# Patient Record
Sex: Male | Born: 1950 | Race: White | Hispanic: No | Marital: Married | Smoking: Current every day smoker
Health system: Southern US, Community
[De-identification: ages and names within clinical notes are randomized; demographics above are authoritative.]

## PROBLEM LIST (undated history)

## (undated) DIAGNOSIS — I2510 Atherosclerotic heart disease of native coronary artery without angina pectoris: Secondary | ICD-10-CM

## (undated) DIAGNOSIS — F17200 Nicotine dependence, unspecified, uncomplicated: Secondary | ICD-10-CM

## (undated) DIAGNOSIS — E785 Hyperlipidemia, unspecified: Secondary | ICD-10-CM

## (undated) HISTORY — DX: Hyperlipidemia, unspecified: E78.5

## (undated) HISTORY — PX: NECK SURGERY: SHX720

## (undated) HISTORY — PX: LUNG BIOPSY: SHX232

## (undated) HISTORY — PX: ANAL FISSURE REPAIR: SHX2312

## (undated) HISTORY — DX: Nicotine dependence, unspecified, uncomplicated: F17.200

## (undated) HISTORY — PX: OTHER SURGICAL HISTORY: SHX169

## (undated) HISTORY — DX: Atherosclerotic heart disease of native coronary artery without angina pectoris: I25.10

## (undated) HISTORY — PX: NEPHRECTOMY: SHX65

---

## 1999-01-04 ENCOUNTER — Ambulatory Visit: Admission: RE | Admit: 1999-01-04 | Discharge: 1999-01-04 | Payer: Self-pay | Admitting: Internal Medicine

## 1999-01-29 ENCOUNTER — Emergency Department (HOSPITAL_COMMUNITY): Admission: EM | Admit: 1999-01-29 | Discharge: 1999-01-29 | Payer: Self-pay | Admitting: Emergency Medicine

## 1999-01-29 ENCOUNTER — Encounter: Payer: Self-pay | Admitting: Emergency Medicine

## 1999-03-07 ENCOUNTER — Ambulatory Visit: Admission: RE | Admit: 1999-03-07 | Discharge: 1999-03-07 | Payer: Self-pay | Admitting: Internal Medicine

## 2000-03-08 ENCOUNTER — Encounter: Payer: Self-pay | Admitting: Emergency Medicine

## 2000-03-08 ENCOUNTER — Observation Stay (HOSPITAL_COMMUNITY): Admission: EM | Admit: 2000-03-08 | Discharge: 2000-03-08 | Payer: Self-pay | Admitting: Emergency Medicine

## 2000-12-03 ENCOUNTER — Encounter: Admission: RE | Admit: 2000-12-03 | Discharge: 2000-12-03 | Payer: Self-pay | Admitting: *Deleted

## 2000-12-03 ENCOUNTER — Encounter: Payer: Self-pay | Admitting: *Deleted

## 2000-12-04 ENCOUNTER — Ambulatory Visit (HOSPITAL_BASED_OUTPATIENT_CLINIC_OR_DEPARTMENT_OTHER): Admission: RE | Admit: 2000-12-04 | Discharge: 2000-12-04 | Payer: Self-pay | Admitting: *Deleted

## 2000-12-04 ENCOUNTER — Encounter (INDEPENDENT_AMBULATORY_CARE_PROVIDER_SITE_OTHER): Payer: Self-pay | Admitting: *Deleted

## 2002-11-18 ENCOUNTER — Encounter: Payer: Self-pay | Admitting: *Deleted

## 2002-11-18 ENCOUNTER — Ambulatory Visit (HOSPITAL_COMMUNITY): Admission: RE | Admit: 2002-11-18 | Discharge: 2002-11-18 | Payer: Self-pay | Admitting: *Deleted

## 2003-01-02 ENCOUNTER — Encounter: Payer: Self-pay | Admitting: Orthopedic Surgery

## 2003-01-02 ENCOUNTER — Ambulatory Visit (HOSPITAL_COMMUNITY): Admission: RE | Admit: 2003-01-02 | Discharge: 2003-01-02 | Payer: Self-pay | Admitting: Orthopedic Surgery

## 2008-08-18 ENCOUNTER — Ambulatory Visit (HOSPITAL_COMMUNITY): Admission: RE | Admit: 2008-08-18 | Discharge: 2008-08-18 | Payer: Self-pay | Admitting: Urology

## 2008-08-27 ENCOUNTER — Ambulatory Visit (HOSPITAL_COMMUNITY): Admission: RE | Admit: 2008-08-27 | Discharge: 2008-08-27 | Payer: Self-pay | Admitting: Urology

## 2008-09-10 ENCOUNTER — Encounter: Payer: Self-pay | Admitting: Urology

## 2008-09-10 ENCOUNTER — Inpatient Hospital Stay (HOSPITAL_COMMUNITY): Admission: RE | Admit: 2008-09-10 | Discharge: 2008-09-12 | Payer: Self-pay | Admitting: Urology

## 2008-11-03 ENCOUNTER — Inpatient Hospital Stay (HOSPITAL_COMMUNITY): Admission: AD | Admit: 2008-11-03 | Discharge: 2008-11-04 | Payer: Self-pay | Admitting: Surgery

## 2008-11-27 ENCOUNTER — Ambulatory Visit (HOSPITAL_COMMUNITY): Admission: RE | Admit: 2008-11-27 | Discharge: 2008-11-27 | Payer: Self-pay | Admitting: Urology

## 2009-03-22 HISTORY — PX: CARDIOVASCULAR STRESS TEST: SHX262

## 2009-05-05 ENCOUNTER — Ambulatory Visit (HOSPITAL_COMMUNITY): Admission: RE | Admit: 2009-05-05 | Discharge: 2009-05-05 | Payer: Self-pay | Admitting: Urology

## 2009-09-27 ENCOUNTER — Ambulatory Visit (HOSPITAL_COMMUNITY): Admission: RE | Admit: 2009-09-27 | Discharge: 2009-09-27 | Payer: Self-pay | Admitting: Urology

## 2009-10-06 ENCOUNTER — Ambulatory Visit: Payer: Self-pay | Admitting: Thoracic Surgery

## 2009-10-07 ENCOUNTER — Ambulatory Visit: Payer: Self-pay | Admitting: Thoracic Surgery

## 2009-10-14 ENCOUNTER — Inpatient Hospital Stay (HOSPITAL_COMMUNITY): Admission: RE | Admit: 2009-10-14 | Discharge: 2009-10-17 | Payer: Self-pay | Admitting: Thoracic Surgery

## 2009-10-14 ENCOUNTER — Encounter: Payer: Self-pay | Admitting: Thoracic Surgery

## 2009-10-17 ENCOUNTER — Ambulatory Visit: Payer: Self-pay | Admitting: Hematology and Oncology

## 2009-10-19 ENCOUNTER — Ambulatory Visit: Payer: Self-pay | Admitting: Hematology and Oncology

## 2009-10-19 LAB — CBC WITH DIFFERENTIAL/PLATELET
BASO%: 0.4 % (ref 0.0–2.0)
HCT: 39 % (ref 38.4–49.9)
HGB: 13.4 g/dL (ref 13.0–17.1)
MCHC: 34.3 g/dL (ref 32.0–36.0)
MONO#: 0.6 10*3/uL (ref 0.1–0.9)
NEUT%: 53.7 % (ref 39.0–75.0)
WBC: 6.1 10*3/uL (ref 4.0–10.3)
lymph#: 1.9 10*3/uL (ref 0.9–3.3)

## 2009-10-19 LAB — COMPREHENSIVE METABOLIC PANEL
ALT: 18 U/L (ref 0–53)
AST: 18 U/L (ref 0–37)
BUN: 25 mg/dL — ABNORMAL HIGH (ref 6–23)
CO2: 29 mEq/L (ref 19–32)
Chloride: 107 mEq/L (ref 96–112)
Creatinine, Ser: 1.61 mg/dL — ABNORMAL HIGH (ref 0.40–1.50)
Glucose, Bld: 104 mg/dL — ABNORMAL HIGH (ref 70–99)
Total Bilirubin: 0.4 mg/dL (ref 0.3–1.2)
Total Protein: 6.4 g/dL (ref 6.0–8.3)

## 2009-10-22 ENCOUNTER — Encounter: Admission: RE | Admit: 2009-10-22 | Discharge: 2009-10-22 | Payer: Self-pay | Admitting: Thoracic Surgery

## 2009-10-22 ENCOUNTER — Ambulatory Visit: Payer: Self-pay | Admitting: Thoracic Surgery

## 2009-10-27 LAB — COMPREHENSIVE METABOLIC PANEL
Alkaline Phosphatase: 38 U/L — ABNORMAL LOW (ref 39–117)
CO2: 21 mEq/L (ref 19–32)
Creatinine, Ser: 1.28 mg/dL (ref 0.40–1.50)
Glucose, Bld: 122 mg/dL — ABNORMAL HIGH (ref 70–99)
Total Bilirubin: 0.5 mg/dL (ref 0.3–1.2)

## 2009-11-01 ENCOUNTER — Ambulatory Visit (HOSPITAL_COMMUNITY): Admission: RE | Admit: 2009-11-01 | Discharge: 2009-11-01 | Payer: Self-pay | Admitting: Hematology and Oncology

## 2009-11-16 ENCOUNTER — Encounter: Admission: RE | Admit: 2009-11-16 | Discharge: 2009-11-16 | Payer: Self-pay | Admitting: Thoracic Surgery

## 2009-11-16 ENCOUNTER — Ambulatory Visit: Payer: Self-pay | Admitting: Thoracic Surgery

## 2010-04-18 ENCOUNTER — Ambulatory Visit (HOSPITAL_COMMUNITY): Admission: RE | Admit: 2010-04-18 | Discharge: 2010-04-18 | Payer: Self-pay | Admitting: General Surgery

## 2010-07-30 IMAGING — CR DG CHEST 2V
2 series · 2 of 2 positions shown · non-contrast
Comparison: 10/16/2009

CLINICAL DATA: Chest tube removal.

CHEST - 2 VIEW

[w chest pa]
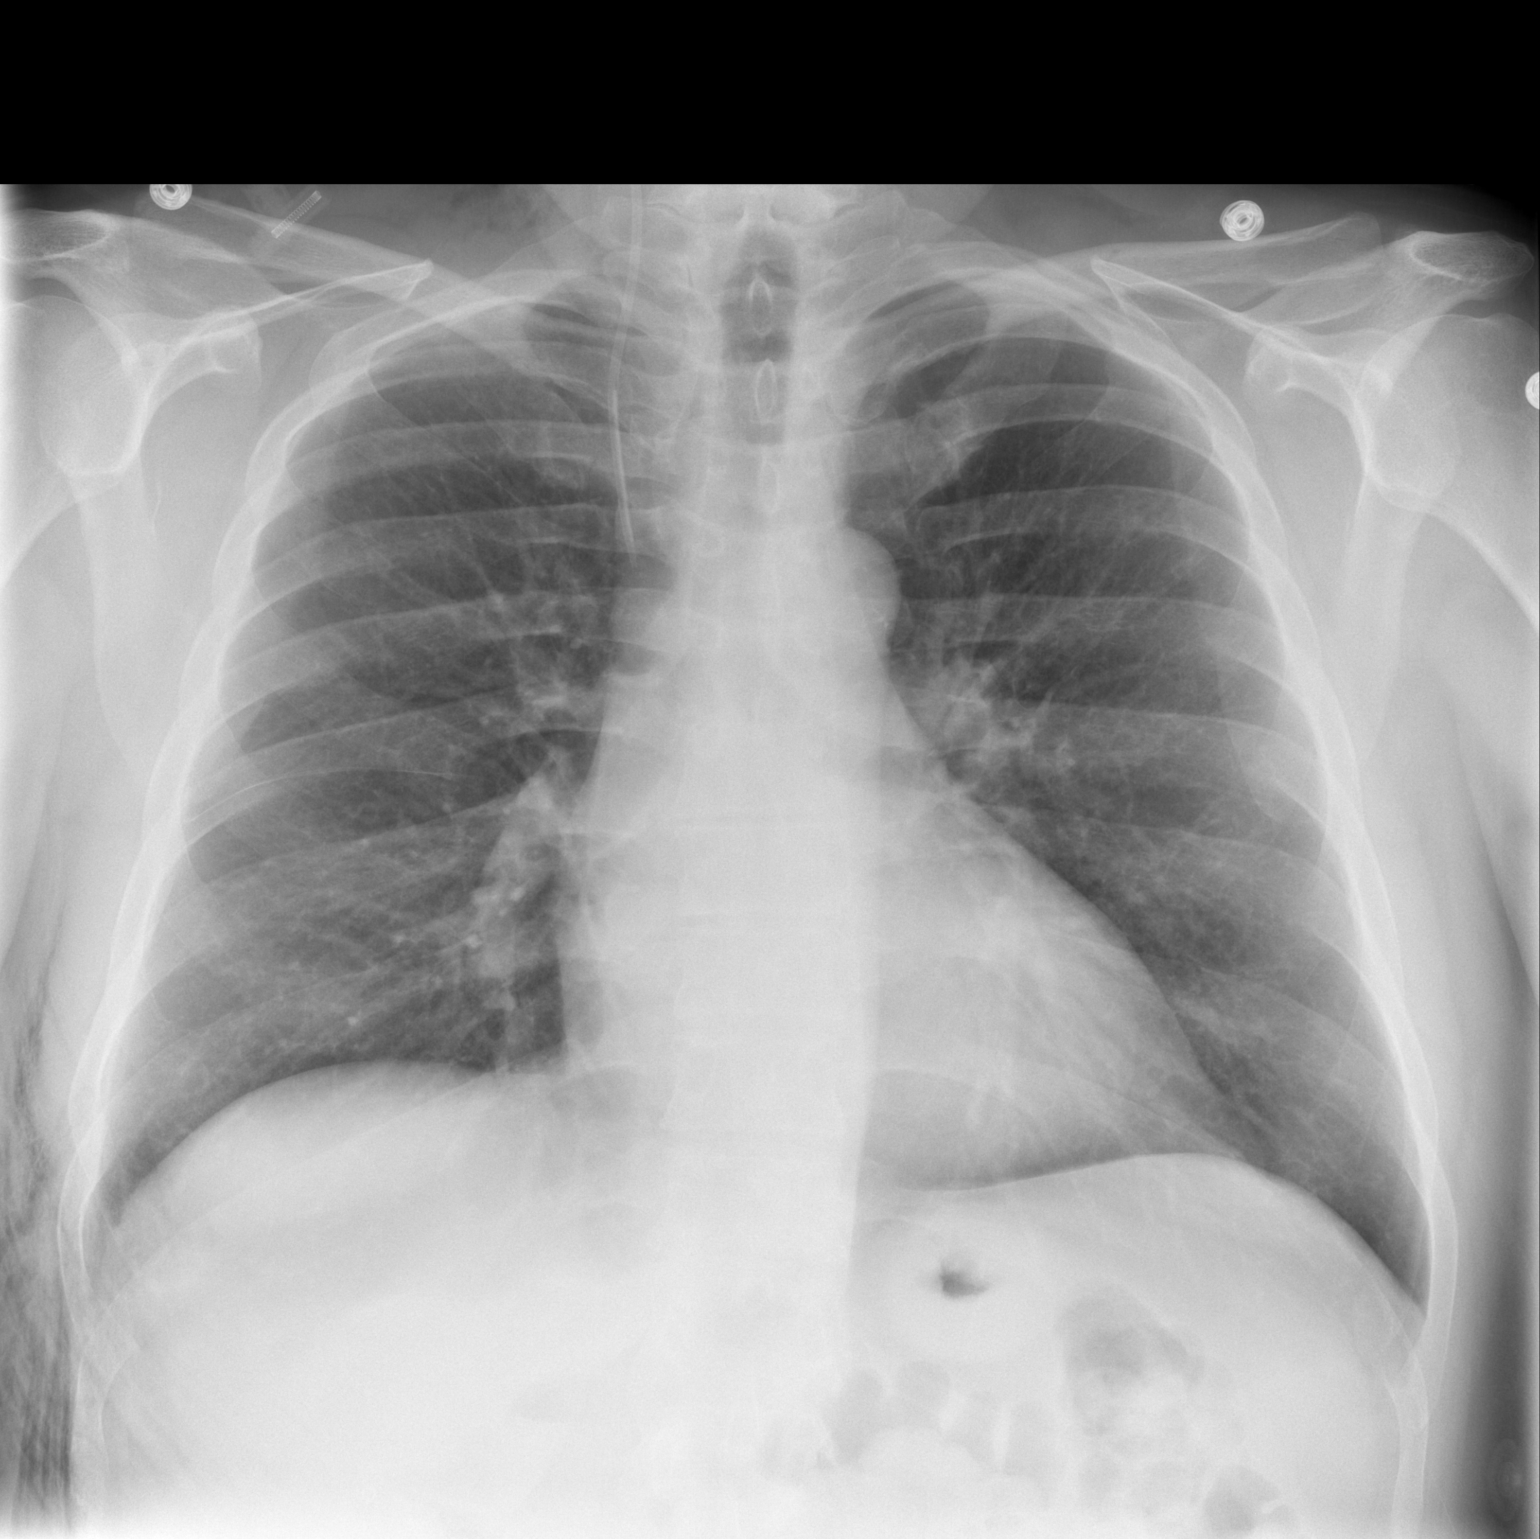

[w chest lat]
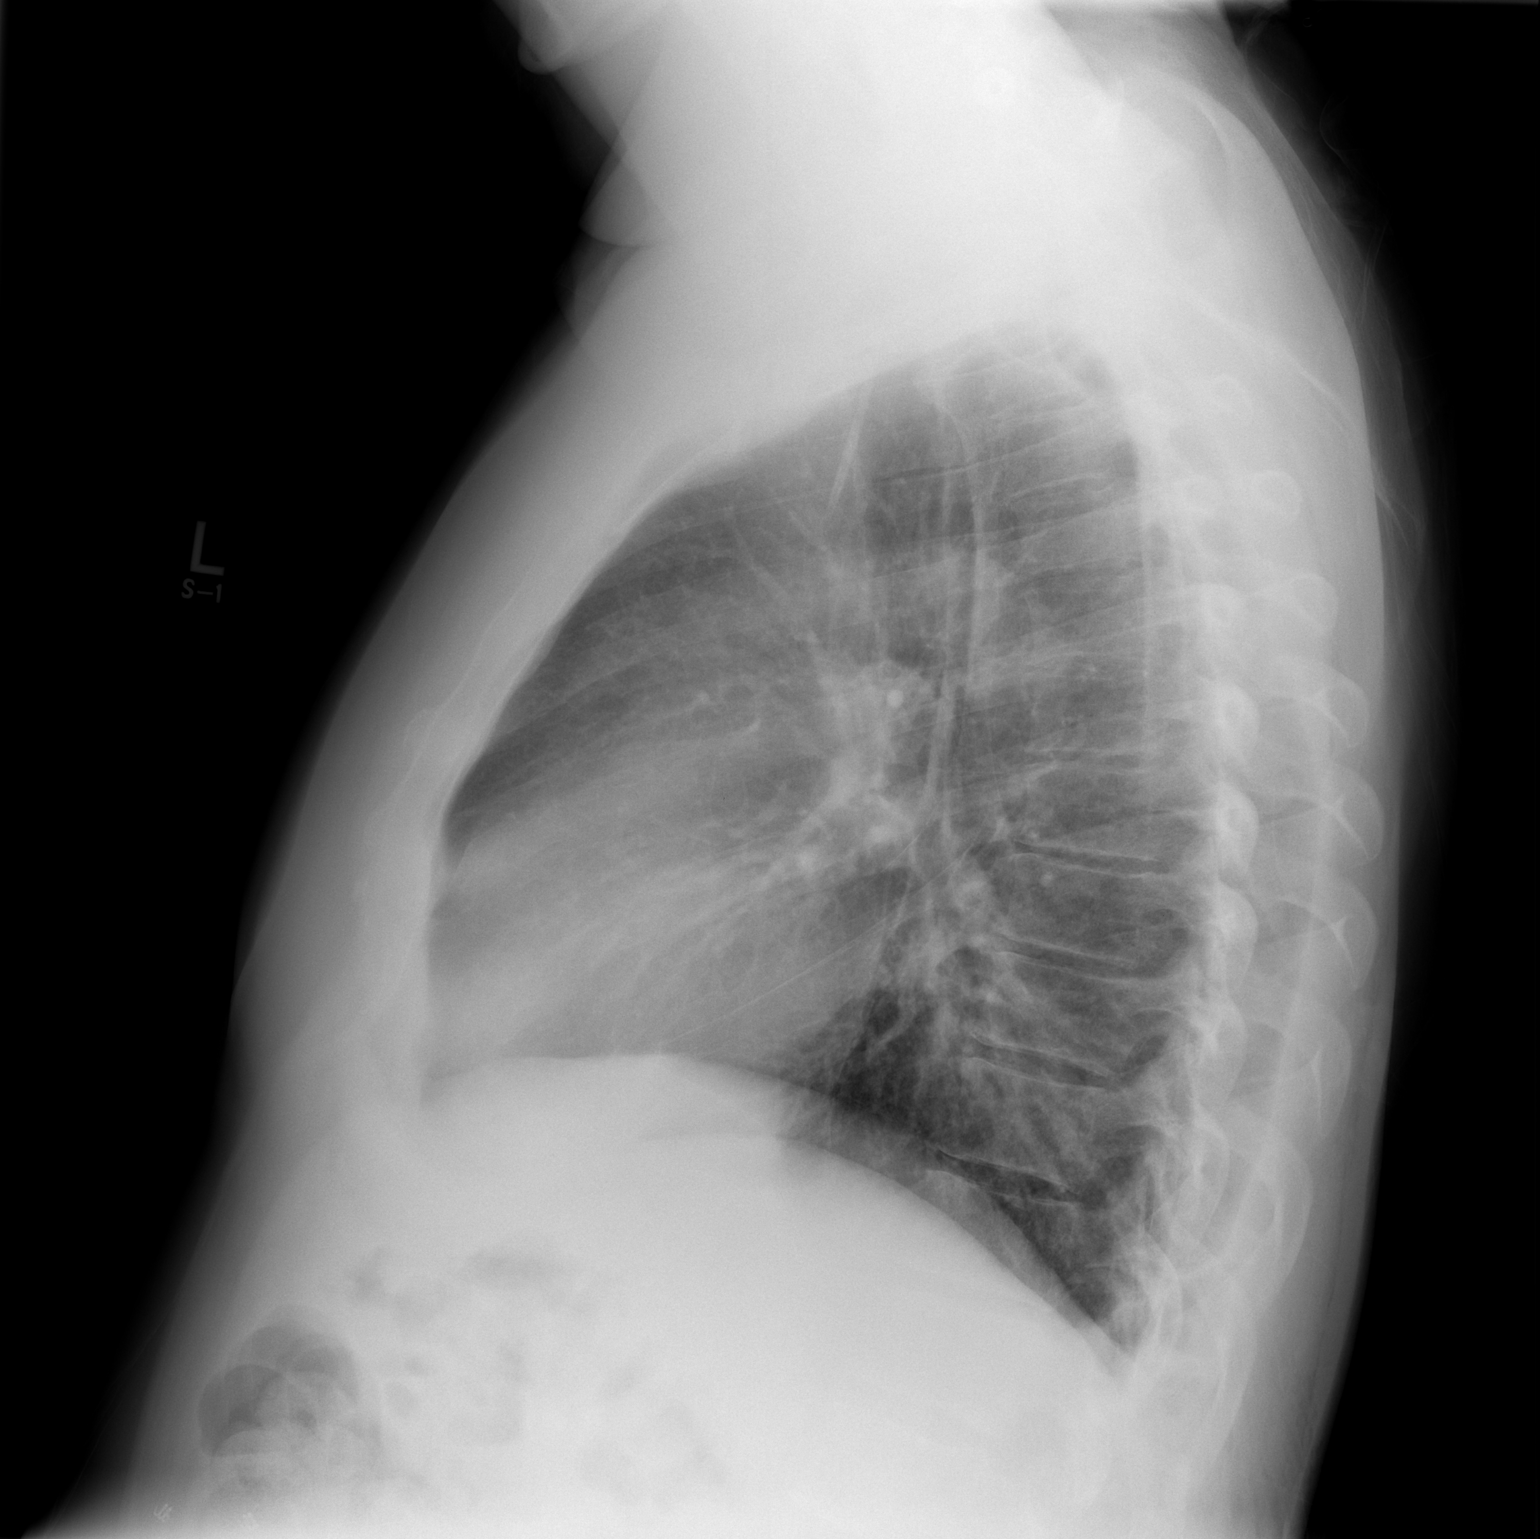

[2 of 2 positions shown; findings below may reference images not displayed]

FINDINGS: Right central line remains in place, unchanged.  No
pneumothorax.  Right subcutaneous air noted.  Lungs are clear.
Heart is borderline in size.  No effusions.
IMPRESSION: No pneumothorax.  No acute findings.

## 2010-08-04 IMAGING — CR DG CHEST 2V
2 series · 2 of 2 positions shown · non-contrast
Comparison: 10/17/2009

CLINICAL DATA: Status post right VATS.

CHEST - 2 VIEW

[w chest pa *]
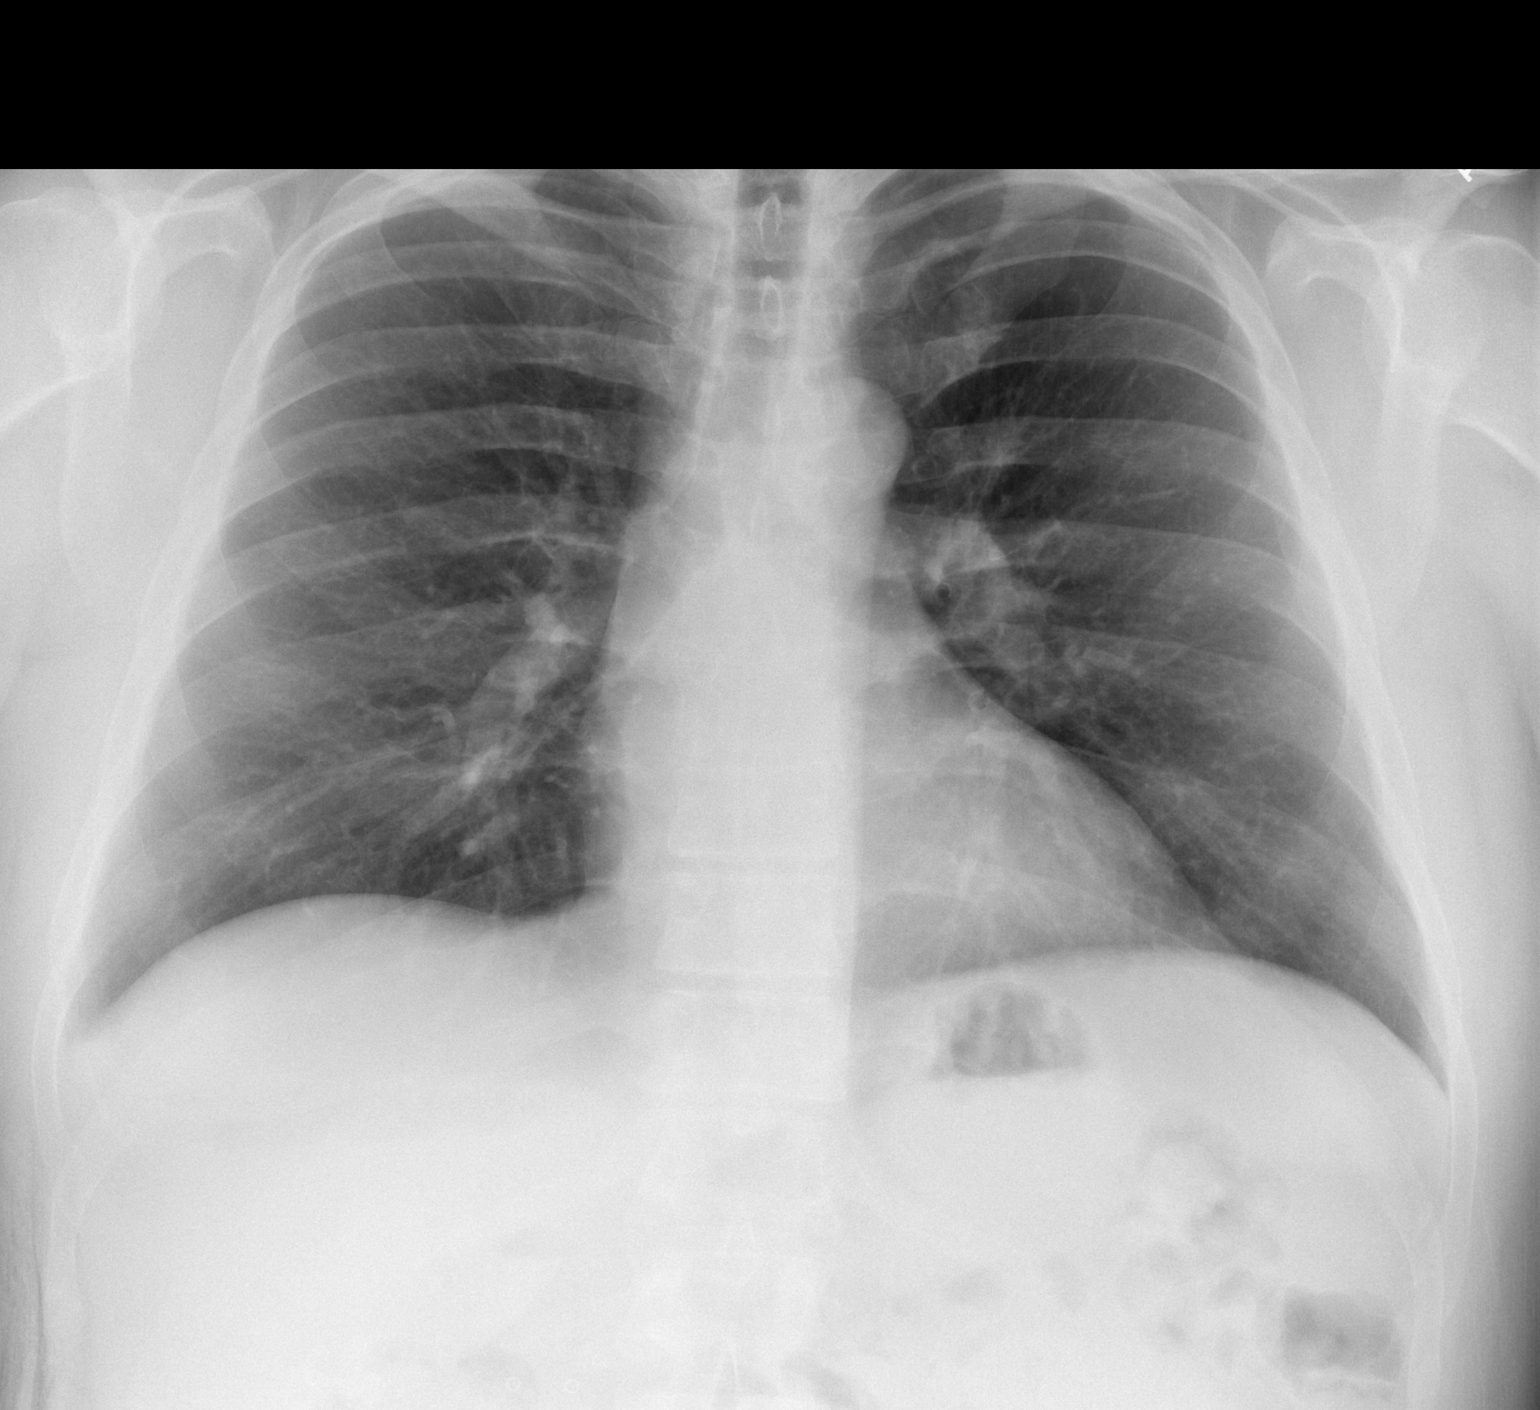

[w chest lat *]
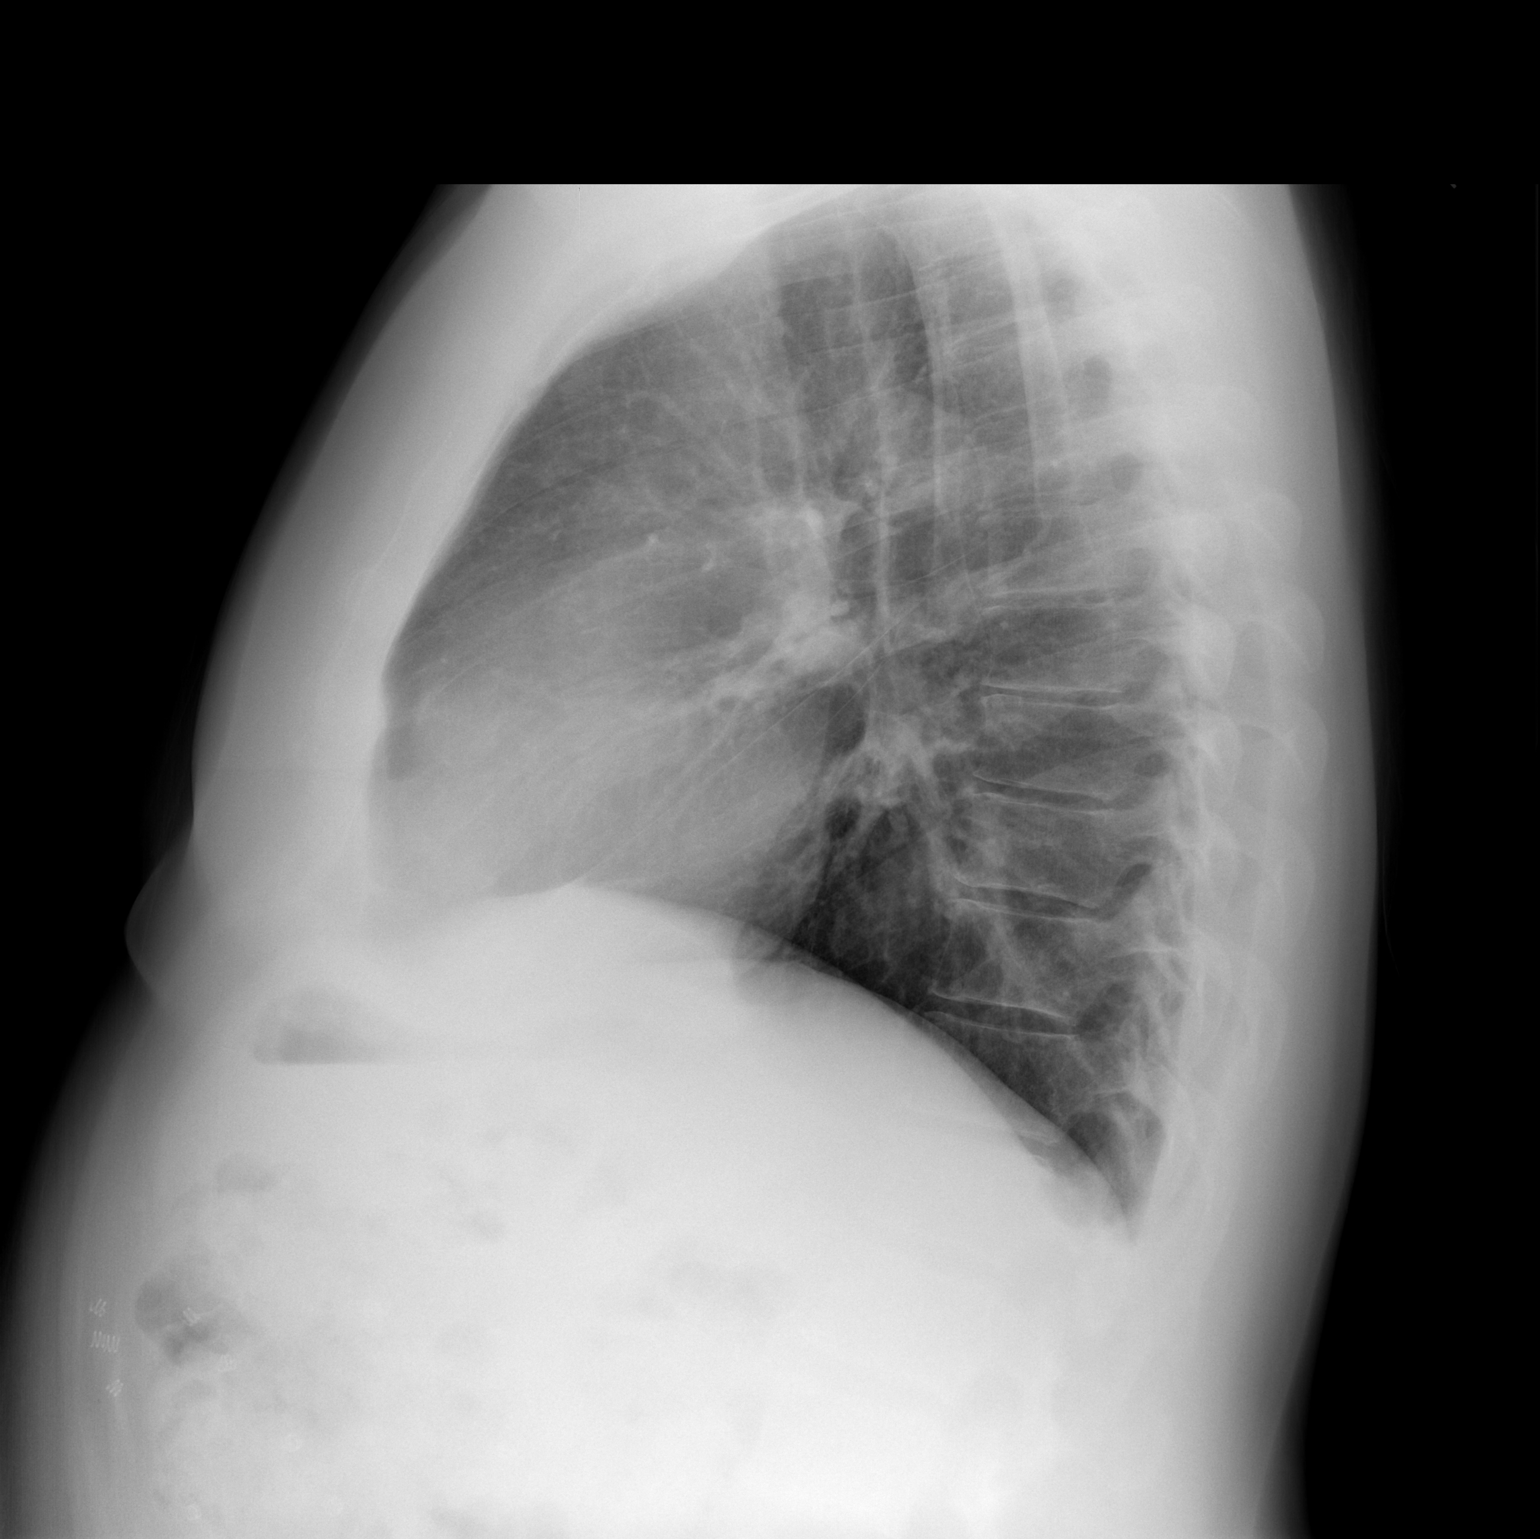

[2 of 2 positions shown; findings below may reference images not displayed]

FINDINGS: Two views of the chest demonstrate clear lungs.  No
evidence for a  pneumothorax.  The right jugular central line has
been removed.  No evidence for pleural effusions. Normal appearance
of the cardiac silhouette.  The trachea is midline.
IMPRESSION: No acute chest findings.

## 2010-08-23 ENCOUNTER — Ambulatory Visit: Payer: Self-pay | Admitting: Cardiology

## 2010-08-29 IMAGING — CR DG CHEST 2V
2 series · 2 of 2 positions shown · non-contrast
Comparison: Chest x-ray of 10/22/2009

CLINICAL DATA: Status post right VATS, follow-up

CHEST - 2 VIEW

[view not recorded (1 of 2)]
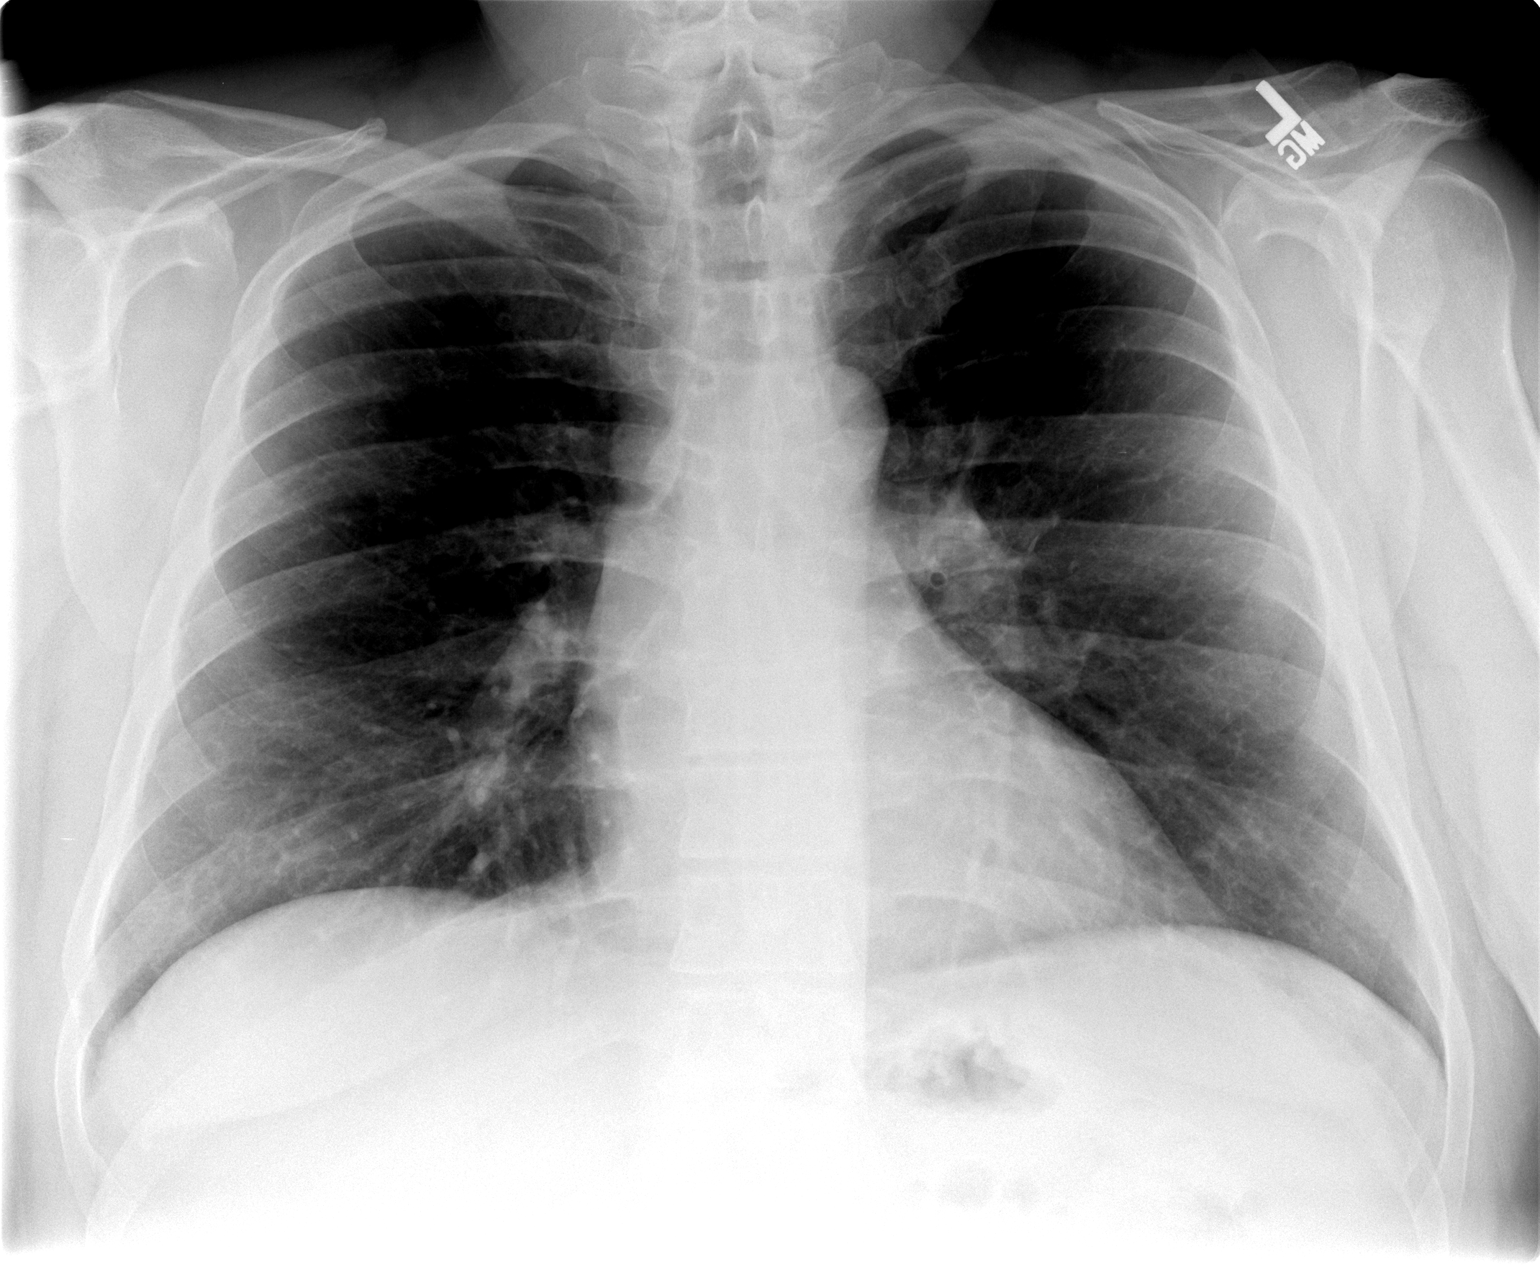

[view not recorded (2 of 2)]
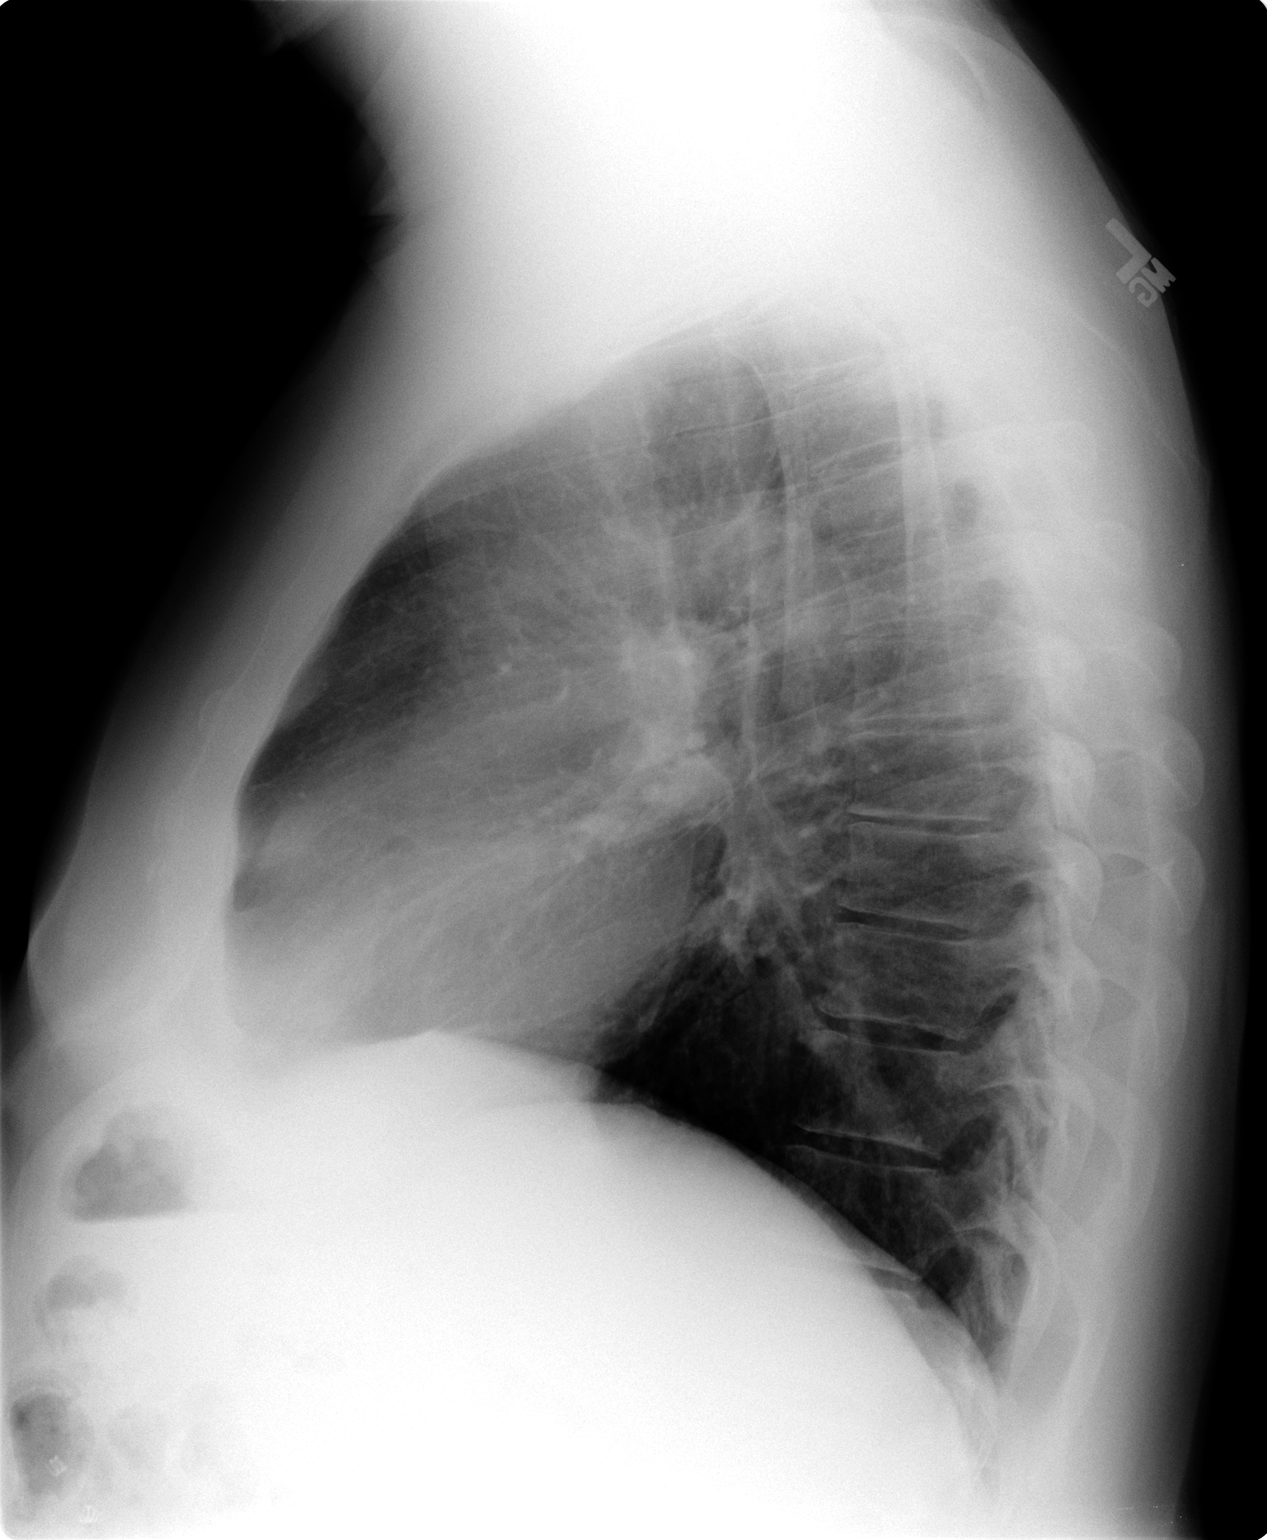

[2 of 2 positions shown; findings below may reference images not displayed]

FINDINGS: The lungs are clear.  No pneumothorax is seen.
Mediastinal contours are stable.  The heart is within normal limits
in size.  No bony abnormality is seen.
IMPRESSION: No active lung disease.

## 2010-10-21 ENCOUNTER — Emergency Department (HOSPITAL_COMMUNITY)
Admission: EM | Admit: 2010-10-21 | Discharge: 2010-10-21 | Disposition: A | Discharge status: Home / Self Care | Payer: PRIVATE HEALTH INSURANCE | Attending: Emergency Medicine | Admitting: Emergency Medicine

## 2010-10-21 ENCOUNTER — Emergency Department (HOSPITAL_COMMUNITY): Payer: PRIVATE HEALTH INSURANCE

## 2010-10-21 DIAGNOSIS — E7800 Pure hypercholesterolemia, unspecified: Secondary | ICD-10-CM | POA: Insufficient documentation

## 2010-10-21 DIAGNOSIS — K8020 Calculus of gallbladder without cholecystitis without obstruction: Secondary | ICD-10-CM | POA: Insufficient documentation

## 2010-10-21 DIAGNOSIS — S93409A Sprain of unspecified ligament of unspecified ankle, initial encounter: Secondary | ICD-10-CM | POA: Insufficient documentation

## 2010-10-21 DIAGNOSIS — I10 Essential (primary) hypertension: Secondary | ICD-10-CM | POA: Insufficient documentation

## 2010-10-21 DIAGNOSIS — M7989 Other specified soft tissue disorders: Secondary | ICD-10-CM | POA: Insufficient documentation

## 2010-10-21 DIAGNOSIS — X58XXXA Exposure to other specified factors, initial encounter: Secondary | ICD-10-CM | POA: Insufficient documentation

## 2010-10-21 DIAGNOSIS — Z9221 Personal history of antineoplastic chemotherapy: Secondary | ICD-10-CM | POA: Insufficient documentation

## 2010-10-21 DIAGNOSIS — C649 Malignant neoplasm of unspecified kidney, except renal pelvis: Secondary | ICD-10-CM | POA: Insufficient documentation

## 2010-10-21 DIAGNOSIS — C7800 Secondary malignant neoplasm of unspecified lung: Secondary | ICD-10-CM | POA: Insufficient documentation

## 2010-10-21 LAB — DIFFERENTIAL
Basophils Absolute: 0 10*3/uL (ref 0.0–0.1)
Basophils Relative: 0 % (ref 0–1)
Eosinophils Absolute: 0.1 10*3/uL (ref 0.0–0.7)
Monocytes Absolute: 0.4 10*3/uL (ref 0.1–1.0)
Monocytes Relative: 8 % (ref 3–12)
Neutro Abs: 2.2 10*3/uL (ref 1.7–7.7)
Neutrophils Relative %: 51 % (ref 43–77)

## 2010-10-21 LAB — URINALYSIS, ROUTINE W REFLEX MICROSCOPIC
Bilirubin Urine: NEGATIVE
Hgb urine dipstick: NEGATIVE
Ketones, ur: NEGATIVE mg/dL
Protein, ur: NEGATIVE mg/dL
Urine Glucose, Fasting: NEGATIVE mg/dL
Urobilinogen, UA: 0.2 mg/dL (ref 0.0–1.0)

## 2010-10-21 LAB — CBC
HCT: 34.7 % — ABNORMAL LOW (ref 39.0–52.0)
Hemoglobin: 11.4 g/dL — ABNORMAL LOW (ref 13.0–17.0)
MCH: 26.3 pg (ref 26.0–34.0)
MCHC: 32.9 g/dL (ref 30.0–36.0)

## 2010-10-21 LAB — POCT I-STAT, CHEM 8
BUN: 21 mg/dL (ref 6–23)
Calcium, Ion: 1.21 mmol/L (ref 1.12–1.32)
Chloride: 110 mEq/L (ref 96–112)
HCT: 35 % — ABNORMAL LOW (ref 39.0–52.0)
Potassium: 3.8 mEq/L (ref 3.5–5.1)

## 2010-10-21 MED ORDER — IOHEXOL 300 MG/ML  SOLN
75.0000 mL | Freq: Once | INTRAMUSCULAR | Status: AC | PRN
  Administered 2010-10-21: 75 mL via INTRAVENOUS

## 2010-10-23 LAB — URINE CULTURE: Culture  Setup Time: 201202180112

## 2010-11-18 LAB — DIFFERENTIAL
Basophils Absolute: 0 10*3/uL (ref 0.0–0.1)
Basophils Relative: 0 % (ref 0–1)
Monocytes Absolute: 0.3 10*3/uL (ref 0.1–1.0)
Neutro Abs: 1.4 10*3/uL — ABNORMAL LOW (ref 1.7–7.7)
Neutrophils Relative %: 47 % (ref 43–77)

## 2010-11-18 LAB — COMPREHENSIVE METABOLIC PANEL
Albumin: 3.4 g/dL — ABNORMAL LOW (ref 3.5–5.2)
Alkaline Phosphatase: 37 U/L — ABNORMAL LOW (ref 39–117)
BUN: 14 mg/dL (ref 6–23)
CO2: 26 mEq/L (ref 19–32)
Chloride: 110 mEq/L (ref 96–112)
GFR calc non Af Amer: 56 mL/min — ABNORMAL LOW (ref >60)
Glucose, Bld: 105 mg/dL — ABNORMAL HIGH (ref 70–99)
Potassium: 4.2 mEq/L (ref 3.5–5.1)
Total Bilirubin: 0.4 mg/dL (ref 0.3–1.2)

## 2010-11-18 LAB — CBC
HCT: 29.5 % — ABNORMAL LOW (ref 39.0–52.0)
Hemoglobin: 10.5 g/dL — ABNORMAL LOW (ref 13.0–17.0)
MCV: 102 fL — ABNORMAL HIGH (ref 78.0–100.0)
RBC: 2.9 MIL/uL — ABNORMAL LOW (ref 4.22–5.81)
WBC: 3 10*3/uL — ABNORMAL LOW (ref 4.0–10.5)

## 2010-11-18 LAB — SURGICAL PCR SCREEN: Staphylococcus aureus: POSITIVE — AB

## 2010-11-20 LAB — PROTIME-INR: Prothrombin Time: 13.1 seconds (ref 11.6–15.2)

## 2010-11-20 LAB — CBC
HCT: 41.5 % (ref 39.0–52.0)
Hemoglobin: 14.3 g/dL (ref 13.0–17.0)
MCHC: 34.5 g/dL (ref 30.0–36.0)
MCV: 91.9 fL (ref 78.0–100.0)
RBC: 4.51 MIL/uL (ref 4.22–5.81)
WBC: 5.8 10*3/uL (ref 4.0–10.5)

## 2010-11-23 LAB — URINALYSIS, ROUTINE W REFLEX MICROSCOPIC
Glucose, UA: NEGATIVE mg/dL
Hgb urine dipstick: NEGATIVE
Specific Gravity, Urine: 1.02 (ref 1.005–1.030)
pH: 7 (ref 5.0–8.0)

## 2010-11-23 LAB — TYPE AND SCREEN
ABO/RH(D): O POS
Antibody Screen: NEGATIVE

## 2010-11-23 LAB — BASIC METABOLIC PANEL
CO2: 28 mEq/L (ref 19–32)
Calcium: 8.5 mg/dL (ref 8.4–10.5)
Glucose, Bld: 140 mg/dL — ABNORMAL HIGH (ref 70–99)
Sodium: 138 mEq/L (ref 135–145)

## 2010-11-23 LAB — COMPREHENSIVE METABOLIC PANEL
ALT: 24 U/L (ref 0–53)
ALT: 25 U/L (ref 0–53)
AST: 28 U/L (ref 0–37)
Albumin: 3.3 g/dL — ABNORMAL LOW (ref 3.5–5.2)
Albumin: 4.2 g/dL (ref 3.5–5.2)
Alkaline Phosphatase: 41 U/L (ref 39–117)
CO2: 32 mEq/L (ref 19–32)
Calcium: 8.9 mg/dL (ref 8.4–10.5)
Chloride: 105 mEq/L (ref 96–112)
GFR calc Af Amer: >60 mL/min (ref >60)
Glucose, Bld: 110 mg/dL — ABNORMAL HIGH (ref 70–99)
Potassium: 4 mEq/L (ref 3.5–5.1)
Sodium: 136 mEq/L (ref 135–145)
Sodium: 139 mEq/L (ref 135–145)
Total Protein: 5.8 g/dL — ABNORMAL LOW (ref 6.0–8.3)
Total Protein: 6.6 g/dL (ref 6.0–8.3)

## 2010-11-23 LAB — CBC
Hemoglobin: 11.7 g/dL — ABNORMAL LOW (ref 13.0–17.0)
Hemoglobin: 13.7 g/dL (ref 13.0–17.0)
MCHC: 34.8 g/dL (ref 30.0–36.0)
MCHC: 35.1 g/dL (ref 30.0–36.0)
Platelets: 148 10*3/uL — ABNORMAL LOW (ref 150–400)
Platelets: 197 10*3/uL (ref 150–400)
RBC: 3.57 MIL/uL — ABNORMAL LOW (ref 4.22–5.81)
RDW: 12.3 % (ref 11.5–15.5)
RDW: 12.5 % (ref 11.5–15.5)
RDW: 13 % (ref 11.5–15.5)
WBC: 5.8 10*3/uL (ref 4.0–10.5)

## 2010-11-23 LAB — BLOOD GAS, ARTERIAL
Acid-Base Excess: 2.7 mmol/L — ABNORMAL HIGH (ref 0.0–2.0)
Bicarbonate: 26.9 mEq/L — ABNORMAL HIGH (ref 20.0–24.0)
FIO2: 0.21 %
Patient temperature: 98.6
Patient temperature: 98.6
TCO2: 28.2 mmol/L (ref 0–100)
pCO2 arterial: 40.1 mmHg (ref 35.0–45.0)
pCO2 arterial: 42.8 mmHg (ref 35.0–45.0)
pH, Arterial: 7.442 (ref 7.350–7.450)

## 2010-11-23 LAB — ABO/RH: ABO/RH(D): O POS

## 2010-11-23 LAB — MRSA PCR SCREENING: MRSA by PCR: NEGATIVE

## 2010-12-15 LAB — BASIC METABOLIC PANEL
BUN: 23 mg/dL (ref 6–23)
CO2: 27 mEq/L (ref 19–32)
Chloride: 106 mEq/L (ref 96–112)
Creatinine, Ser: 1.47 mg/dL (ref 0.4–1.5)
Glucose, Bld: 107 mg/dL — ABNORMAL HIGH (ref 70–99)

## 2010-12-19 LAB — CBC
HCT: 36.2 % — ABNORMAL LOW (ref 39.0–52.0)
HCT: 46.8 % (ref 39.0–52.0)
Hemoglobin: 11.8 g/dL — ABNORMAL LOW (ref 13.0–17.0)
Hemoglobin: 12.4 g/dL — ABNORMAL LOW (ref 13.0–17.0)
Hemoglobin: 12.9 g/dL — ABNORMAL LOW (ref 13.0–17.0)
MCHC: 34.2 g/dL (ref 30.0–36.0)
MCV: 82.4 fL (ref 78.0–100.0)
MCV: 83.2 fL (ref 78.0–100.0)
Platelets: 220 10*3/uL (ref 150–400)
RBC: 4.19 MIL/uL — ABNORMAL LOW (ref 4.22–5.81)
RBC: 4.47 MIL/uL (ref 4.22–5.81)
RBC: 5.63 MIL/uL (ref 4.22–5.81)
RDW: 14.1 % (ref 11.5–15.5)
WBC: 4.9 10*3/uL (ref 4.0–10.5)
WBC: 7.4 10*3/uL (ref 4.0–10.5)
WBC: 8.3 10*3/uL (ref 4.0–10.5)

## 2010-12-19 LAB — BASIC METABOLIC PANEL
BUN: 14 mg/dL (ref 6–23)
Calcium: 8.6 mg/dL (ref 8.4–10.5)
Chloride: 104 mEq/L (ref 96–112)
Chloride: 106 mEq/L (ref 96–112)
Creatinine, Ser: 1.33 mg/dL (ref 0.4–1.5)
GFR calc Af Amer: >60 mL/min (ref >60)
GFR calc Af Amer: >60 mL/min (ref >60)
GFR calc non Af Amer: 50 mL/min — ABNORMAL LOW (ref >60)
GFR calc non Af Amer: 56 mL/min — ABNORMAL LOW (ref >60)
Glucose, Bld: 117 mg/dL — ABNORMAL HIGH (ref 70–99)
Potassium: 3.9 mEq/L (ref 3.5–5.1)
Potassium: 3.9 mEq/L (ref 3.5–5.1)
Sodium: 136 mEq/L (ref 135–145)
Sodium: 139 mEq/L (ref 135–145)

## 2010-12-19 LAB — TYPE AND SCREEN: Antibody Screen: NEGATIVE

## 2010-12-19 LAB — ABO/RH: ABO/RH(D): O POS

## 2011-01-17 NOTE — Op Note (Signed)
NAME:  [name], [name] NO.:  [id number]   MEDICAL RECORD NO.:  [id number]          PATIENT TYPE:  OIB   LOCATION:  1533                         FACILITY:  [facility]   PHYSICIAN:  Ardeth Sportsman, MD     DATE OF BIRTH:  1950-11-09   DATE OF PROCEDURE:  DATE OF DISCHARGE:                               OPERATIVE REPORT   PRIMARY CARE PHYSICIAN:  Dr. Tally Joe.   UROLOGIST:  Dr. Bertram Millard. Dahlstedt.   SURGEON:  Ardeth Sportsman, MD   ASSISTANT:  None.   PREOPERATIVE DIAGNOSIS:  Ventral incisional hernia.   POSTOPERATIVE DIAGNOSIS:  Ventral incisional hernia, incarcerated.   PROCEDURE PERFORMED:  1. Laparoscopic lysis of adhesions x40 minutes (equals one-third of      case).  2. Laparoscopic ventral hernia repair (primary repair with underlay      mesh repair).   ANESTHESIA:  1. General anesthesia.  2. Local anesthetic with a field block around all port sites and      interfascial stitch sites.   ESTIMATED BLOOD LOSS:  Less than 10 mL.   COMPLICATIONS:  None apparent.   INDICATIONS:  [name] is a pleasant 60 year old gentleman who was  found to have cancer of the right kidney and underwent a  laparoscopically assisted right nephrectomy in December 2009.  He  unfortunately developed an incisional hernia that has been rather  painful.  Dr. Retta Diones sent the patient over for surgical consultation.   The anatomy and physiology of the abdominal wall formation was  discussed.  Pathophysiology of herniation with risks of incarceration,  strangulation, worsening pain, etc., were discussed as well.  Options  discussed and recommendations made for diagnostic laparoscopic with  lysis of adhesions with ventral hernia repair.  The risks, benefits and  alternatives were discussed.  The risks of recurrence and infection,  injury to other organs and other risks were discussed specifically as  well.  Other risks were discussed.  Questions were answered and he  agreed  to proceed.   OPERATIVE FINDINGS:  He had greater omentum incarcerated into the large  hernia sac.  The fascial defect was 9 x 10 cm in size.  There was no  evidence of bowel injury or any other problems.   DESCRIPTION OF PROCEDURE:  Informed consent was confirmed.  The patient  underwent general anesthesia without any difficulty.  He had a Foley  catheter sterilely placed.  He had IV cefazolin given to him without any  difficulty.  (Note that there was a question of a penicillin & Kefzol  allergy in the computer, however, there is none in my records and the  patient denies this and the patient has tolerated cefazolin for the  nephrectomy in December 2009.  He had no rash or reaction during the  procedure).  The patient underwent general anesthesia without any  difficulty.  He was positioned supine with both arms tucked.  His  abdomen was prepped and draped in a sterile fashion.   A #5-mm port was placed in the left upper quadrant using optical entry  technique with the  patient in steep reversed Trendelenburg and left side  up.  Camera inspection revealed no intra-abdominal injury.  Under direct  visualization, an 11-mm port was placed in the left flank and a 5-mm  port was placed in the left lower quadrant.   Sharp dissection was done around the edges of the hernia sac to help  reduce the omentum that had densely adhered into the hernia sac.  Gradually I could reduce it and free it off the sac with cold scissors.  Hemostasis was excellent.   The hernia sac was measured through the defect.  A nick was made in the  skin of the hernia sac and the fascial defect was reapproximated using  #1 figure-of-eight PDS stitches interrupted x3, taking 1.5 cm good  fascial bites.  The stitches were placed using laparoscopic suture  passer under direct visualization.  Pressure was reduced down to 8 mmHg.  The stitches were pulled up.  There was no incarceration within it.  Capnoperitoneum was  evacuated.  I tied the fascial stitches down.  Re-  insufflation was done at 12 mm.  Camera inspection noted good fascial  approximation.   A 20 x 25 cm dual sided mesh (Parietex/Seprafilm) was used.  #1 Novofil  stitches were placed x12 on the rough side of the mesh.  The mesh was  rolled rough side in and placed in through the 11-mm fascial defect at  the left flank port site.  The mesh was secured to the anterior  abdominal wall using laparoscopic suture passer, making sure there was  good circumferential 5cm coverage around the repair.  Fascial stitches  were gradually tied down and there was excellent overlap of coverage and  the mesh had a nice flat lay, not overly taught or loose.  A tacker was  used to help secure the edges of the mesh and some of the central part  of the mesh as well.  The fascial defect in the 11-mm port site was  reapproximated using a #1 Novofil stitch using laparoscopic suture  passer under direct visualization.   Camera inspection was made and hemostasis was excellent.  There was no  evidence of any bowel injury or any other abnormalities.  Capnoperitoneum was evacuated and ports reviewed.  The skin was closed  using a 4-0 Monocryl stitch at the prior port sites, as well as the  puncture site in the hernia sac that allowed the initial fascial  closure.  The fascial stitch sites were closed using Steri-Strips to  good result.  The patient was extubated and sent to the recovery room in  stable condition.   I discussed postoperative care with the patient in the clinic and again  just prior to surgery.  I am about to discuss it with his family per his  request.      Ardeth Sportsman, MD  Electronically Signed     SCG/MEDQ  D:  11/02/2008  T:  11/02/2008  Job:  [id number]   cc:   Tally Joe, M.D.  Fax: [phone]   Bertram Millard. Dahlstedt, M.D.  Fax: [phone]

## 2011-01-17 NOTE — Op Note (Signed)
NAME:  KAYLAN, FRIEDMANN NO.:  [id number]   MEDICAL RECORD NO.:  [id number]          PATIENT TYPE:  INP   LOCATION:  0009                         FACILITY:  [facility]   PHYSICIAN:  Bertram Millard. Dahlstedt, M.D.DATE OF BIRTH:  04/09/1951   DATE OF PROCEDURE:  09/10/2008  DATE OF DISCHARGE:                               OPERATIVE REPORT   ASSISTANT:  Dr. Allena Katz.   PREOPERATIVE DIAGNOSIS:  Right renal mass.   POSTOPERATIVE DIAGNOSIS:  Right renal mass.   PROCEDURES:  Right laparoscopic radical nephrectomy.   INDICATIONS:  This is a 60 year old gentleman with a history of  nephrolithiasis who developed hematuria.  Workup revealed a right renal  mass, and as such, the patient was scheduled for a right laparoscopic  nephrectomy after discussion of alternatives as well as risks and  benefits.  Preoperative consent has been signed.   PROCEDURE IN DETAIL:  The patient was brought back to the operating  room, and after successful induction of general endotracheal anesthetic,  he was placed in the right side up lateral decubitus position.  All  pressure points were padded.  Foley catheter was placed, and he was  secured to the bed with the bean bag and tape.  A preoperative time out  was performed, and he received preoperative antibiotics.   Under direct vision, the [facility] trocar was placed just lateral to the  umbilicus.  Once inside, the abdomen was insufflated.  The abdominal  contents were inspected.  No evidence of gross metastatic disease was  evident.  At this point, a 5-mm trocar was placed at the xiphoid  process, and a 12-mm trocar was placed inferiorly.  These trocars were  placed under direct vision.   At this point, we began dissection by mobilizing the colon.  We then  identified the renal hump.  We then proceeded to identify the lower pole  of the kidney, and we identified the ureter as well as the gonadal vein.  We traced the gonadal vein up towards its origin  at the vena cava, and a  small rent was made at the gonadal vein.  This was controlled with  proximal and distal clips.  We then continued and dissected the hilum  further, identified the renal vein, and posterior to this the renal  artery.   We then turned our attention back down to the inferior pole,  reidentified the gonadal vein and ureter, and these were clamped with  Hem-o-lok clips and cut between clamps.  We then proceeded along the  hilum, dissected out the renal artery further, and placed 2 proximal and  1 distal hemoclip clips on it.  Using the same technique, we exposed the  renal vein and placed 2 proximal and 1 distal clip on the vein.  The  renal artery and vein were then cut between the clips, and we continued  by mobilizing the colon.  We started laterally and proceeded superiorly,  and once we had adequately mobilized, the kidney was free.  We then  inspected the nephrectomy bed, and there is no visible bleeding.  Two  pieces  of Surgicel were placed, one along the vena cava and one along  the superior portion of the nephrectomy bed.  At this point, a decision  was made not to leave a JP drain due to no bleeding.   At this point, we placed the kidney into the EndoCatch bag.  We opened  the Hasson incision and delivered the specimen out of the field.  On the  back table, the specimen was incised.  No evidence of intra-collecting  system tumor was seen.  At this point, we removed the 5- and 12-mm ports  under direct vision and closed the fascia with 1-0 PDS.  We  reapproximated the skin with staples, and the procedure was ended.   Please note Dr. Marcine Matar was present throughout the entirety of  the case.   ESTIMATED BLOOD LOSS:  650 mL.   URINE OUTPUT:  Unrecorded.   DRAINS:  Foley catheter.   SPECIMENS:  Right kidney.   COMPLICATIONS:  None apparent.   DISPOSITION:  The patient went to PACU for further care.      Delman Kitten, MD      Bertram Millard. Dahlstedt, M.D.  Electronically Signed    DW/MEDQ  D:  09/10/2008  T:  09/10/2008  Job:  [id number]

## 2011-01-17 NOTE — Letter (Signed)
November 16, 2009   Bertram Millard. Dahlstedt, MD  [address]  [place], [place] [address]   Re:  [name], [name]                DOB:  03-05-1951   Dear Brett Canales:   I saw the patient back today and his incisions are well healed.  His  blood pressure was 127/85, pulse 73, respirations 18, and sats were 97%.  Chest x-ray was stable.  We will see him back again in 4 weeks with a  chest x-ray.   Ines Bloomer, M.D.  Electronically Signed   DPB/MEDQ  D:  11/16/2009  T:  11/17/2009  Job:  [id number]

## 2011-01-17 NOTE — Letter (Signed)
October 06, 2009   Bertram Millard. Dahlstedt, MD  [address]  [place], [place] [address]   Re:  BENIGNO, CHECK                DOB:  [birth year]   Dear Dr. Retta Diones:   I appreciate the opportunity of seeing the patient.  This 60 year old  Caucasian male had a right radical nephrectomy for renal cell cancer,  pathological stage III, clear cell type.  This was done 9 months ago.  He also had a hernia repair done at the same time by Dr. Michaell Cowing.  He has  been followed.  He had a CT scan in June, which was negative for  recurrence and he had another CT scan recently, which showed 3 pleural  nodules; one 8 mm, one 11 mm,  and one 13 mm.  A biopsy was attempted of  one nodule that showed atypical cells.  For this reason, he was referred  to [place] for evaluations of these pleural nodules.  He has had no fever,  chills, excessive sputum.   PAST MEDICAL HISTORY:  He has had some neck surgery.   MEDICATIONS:  His medications include alprazolam 0.5 mg t.i.d., fish  oil, aspirin, hydrochlorothiazide 25 mg daily, ibuprofen, lisinopril 5  mg daily, TriCor 145 mg daily, zolpidem 10 mg daily, metoprolol 25 mg  twice a day, Lipitor 40 mg daily.   ALLERGIES:  Hydrocodone causes nausea and vomiting.   FAMILY HISTORY:  Noncontributory.   SOCIAL HISTORY:  He is a truck Charity fundraiser, has 2 children.  Does Smoke a pack of cigarette a day.  Does not drink alcohol on a  regular basis.   REVIEW OF SYSTEMS:  CONSTITUTIONAL:  He is 5 feet 11, 324 pounds.  In  general, his weight has been stable.  CARDIAC:  No angina or atrial fibrillation.  PULMONARY:  No hemoptysis, asthma or wheezing.  GI:  No nausea, vomiting, constipation, diarrhea.  GU:  No kidney disease, dysuria or frequent urination.  VASCULAR:  No claudication, DVT, TIAs.  NEUROLOGIC:  No dizziness, headaches, blackouts, seizures.  MUSCULOSKELETAL:  No arthritis or joint pain.  PSYCHIATRIC:  No depression or nervous.  EYE/ENT:  No  change in her eyesight or hearing.  HEMATOLOGIC:  No problems with bleeding, clotting disorders, or anemia.   PHYSICAL EXAMINATION:  General:  He is a well developed white male in no  acute distress.  Vital Signs:  His blood pressure is 121/80, pulse 76,  respirations 18, sats were 96%.  Head, Eyes, Ears, Nose and Throat:  Unremarkable.  Neck:  Supple without thyromegaly.  There is no  supraclavicular or axillary adenopathy.  Chest:  Clear to auscultation  and percussion.  Heart:  Regular sinus rhythm.  No murmurs.  Abdomen:  Soft with no hepatosplenomegaly.  Extremities:  Pulses 2+.  There is no  clubbing or edema.  Neurologic:  He is oriented x3.  Sensory and motor  intact.  Abdomen:  There is a surgical scar.   I feel that he has probably has recurrence of his renal cell cancer.  We  discussed the situation with him, and I am planning to do a right VATS  with biopsy and removal of these 3 nodules.  We will do this at [facility] on the February 10.   I appreciate the opportunity of seeing the patient.    Sincerely,   Ines Bloomer, M.D.  Electronically Signed  DPB/MEDQ  D:  10/06/2009  T:  10/07/2009  Job:  [id number]

## 2011-01-17 NOTE — Letter (Signed)
October 22, 2009   Lauretta I. Odogwu, MD  [address]  [place], [place] [address]   Re:  [name], [name]                DOB:  07/27/1951   Dear Vicente Serene,   I saw the patient back today.  Blood pressure is 127/84, pulse 80,  respirations 16, sats were 98%.  His incision is well healed.  I removed  his chest tube sutures.  His chest x-ray showed normal postoperative  changes.  He is doing well overall.  I understand he will be sent back  to [facility] for a second opinion.  We presented him at the cancer  conference and the thought from Dr. Myna Hidalgo and Dr. Arbutus Ped was that he  would probably be a candidate for some type of oral therapy.  I  discussed that with him today, and he said that this what you were  recommending.  I appreciate you taking such good care of him.  I will  see him back again in 3 weeks with a chest x-ray.   Sincerely,   Ines Bloomer, M.D.  Electronically Signed   DPB/MEDQ  D:  10/22/2009  T:  10/23/2009  Job:  [id number]   cc:   Bertram Millard. Dahlstedt, M.D.

## 2011-01-20 NOTE — Discharge Summary (Signed)
NAME:  [name], [name] NO.:  [id number]   MEDICAL RECORD NO.:  [id number]          PATIENT TYPE:  INP   LOCATION:                               FACILITY:  [facility]   PHYSICIAN:  Bertram Millard. Dahlstedt, M.D.DATE OF BIRTH:  11-25-1950   DATE OF ADMISSION:  09/10/2008  DATE OF DISCHARGE:  09/12/2008                               DISCHARGE SUMMARY   Diagnosis:  Renal Cell Carcinoma of right kidney, pathologic stage T3   PRIMARY PROCEDURE:  laparoscopic right radical nephrectomy.   BRIEF HISTORY:  A 60 year old male with large right renal mass.  This  was diagnosed on presentation with flank pain, gross hematuria. Imaging  revealed a right renal mass.  Metastatic survey was negative.  Please  refer to admission H&P for further history.   PAST MEDICAL HISTORY:  1. Hypercholesterolemia.  2. Hypertension.   MEDICATIONS:  Tricor, lisinopril, HCTZ,baby aspirin .  No known drug allergies.   Patient is married.  Is a cigarette smoker.  He is a Naval architect.   REVIEW OF SYSTEMS:  Significant for right flank pain; otherwise,  unremarkable.  No unexplained weight gain or weight loss.  No further  gross hematuria.   PHYSICAL EXAMINATION:  Please see the admission note.   ADMISSION LABS:  Hematocrit 46%.  Chemistry studies were negative.   HOSPITAL COURSE:  The patient was admitted directly to the operating  room where he underwent a laparoscopic-assisted right nephrectomy.  He  tolerated the procedure well.  Postoperatively, he was transferred [facility]  the Urology floor.  He was advanced to a regular diet.  He remained  stable postoperatively.  Mild abdominal distention which resolved with  passing flatus.  The second postoperative day he was discharged in  improved condition.  Incisions all looked good.  He was discharged to  home on Vicodin.  He was also told to take all his regular home  medications.  He will follow up in approximately  10 days for staple   removal.      Bertram Millard. Dahlstedt, M.D.  Electronically Signed     SMD/MEDQ  D:  09/24/2008  T:  09/24/2008  Job:  [id number]

## 2011-01-20 NOTE — Op Note (Signed)
[place]  Patient:    [name], [name]                       MRN: [id number] Proc. Date: 12/04/00 Adm. Date:  [id number] Attending:  Aundria Mems                           Operative Report  PREOPERATIVE DIAGNOSIS:  Recurrent adult laryngeal papillomata.  POSTOPERATIVE DIAGNOSIS:  Recurrent adult laryngeal papillomata, pending histological confirmation.  OPERATION PERFORMED:  Microdirect laryngoscopy and bilateral vocal cord stripping, laryngeal papillomata.  SURGEON:  Kathy Breach, M.D.  ANESTHESIA:  General orotracheal.  DESCRIPTION OF PROCEDURE:  With the patient under general orotracheal anesthesia utilizing the Mallinckrodt tube for laser safety and draping the eyes out with saline soaked pads.  The laser laryngoscope was introduced. However, the patients inability to flex his neck very much, large tongue and his general anatomy allowed inadequate exposure.  I could only see the posterior commissure of the larynx with the regular size and laser scopes. This necessitated visualization available only with the anterior commissure scope, thus limiting access and visualization but being able to use monocular microscopic visualization.  This precluded the very successful use of the laser.  The scope was suspended, visualizing the interior larynx which revealed complete papillomatous growth covering both entire vocal cords. Under visualization with the operating microscope, the entire left cord was stripped clean with papillomatous growth involving the free margin, superior margin somewhat into the ventricle onto the undersurface of the true cord. The right cord was then stripped center fashion.  A small nubbin being left in place at the very anterior commissure portion of the right cord. ____________ documentation procedure was obtained.  The patient tolerated the procedure well and was taken to the recovery room in stable general  condition. He was given 12 mg Decadron IV at completion of the case. DD:  12/04/00 TD:  12/04/00 Job: [id number]

## 2011-03-17 ENCOUNTER — Ambulatory Visit: Payer: PRIVATE HEALTH INSURANCE | Admitting: Cardiology

## 2011-04-09 ENCOUNTER — Other Ambulatory Visit: Payer: Self-pay | Admitting: Cardiology

## 2011-04-10 NOTE — Telephone Encounter (Signed)
escribe medication per fax request  

## 2011-04-25 ENCOUNTER — Encounter: Payer: Self-pay | Admitting: Cardiology

## 2011-05-10 ENCOUNTER — Ambulatory Visit (INDEPENDENT_AMBULATORY_CARE_PROVIDER_SITE_OTHER): Payer: PRIVATE HEALTH INSURANCE | Admitting: Cardiology

## 2011-05-10 ENCOUNTER — Encounter: Payer: Self-pay | Admitting: Cardiology

## 2011-05-10 VITALS — BP 128/70 | HR 73 | Ht 71.0 in | Wt 226.8 lb

## 2011-05-10 DIAGNOSIS — I2510 Atherosclerotic heart disease of native coronary artery without angina pectoris: Secondary | ICD-10-CM

## 2011-05-10 DIAGNOSIS — E785 Hyperlipidemia, unspecified: Secondary | ICD-10-CM

## 2011-05-10 DIAGNOSIS — F17200 Nicotine dependence, unspecified, uncomplicated: Secondary | ICD-10-CM

## 2011-05-10 DIAGNOSIS — C649 Malignant neoplasm of unspecified kidney, except renal pelvis: Secondary | ICD-10-CM | POA: Insufficient documentation

## 2011-05-10 NOTE — Assessment & Plan Note (Signed)
His coronary disease is based on abnormal stress Cardiolite study in July of 2010 which showed evidence of inferior and inferoseptal ischemia. Ejection fraction was 61%. He has been treated medically. He is asymptomatic. He is on therapy with aspirin, metoprolol, and lipid-lowering therapy.

## 2011-05-10 NOTE — Patient Instructions (Signed)
Continue your current medications.  STOP smoking!  I will see you again in 6 months

## 2011-05-10 NOTE — Progress Notes (Signed)
[name] Date of Birth: December 20, 1950   History of Present Illness: [name] is seen today for followup of his coronary disease. He has done very well over the past 8 months. He denies any symptoms of chest pain or shortness of breath. He continues to work over 50 hours a week. He is still on oral chemotherapy for his renal cell carcinoma. He reports that he has had no evidence of new cancer. He continues to smoke one pack per day.  Current Outpatient Prescriptions on File Prior to Visit  Medication Sig Dispense Refill  . aspirin 81 MG tablet Take 81 mg by mouth daily.        Marland Kitchen atorvastatin (LIPITOR) 40 MG tablet Take 40 mg by mouth daily.        Marland Kitchen everolimus (AFINITOR) 10 MG tablet Take 10 mg by mouth daily.        . fenofibrate (TRICOR) 145 MG tablet Take 145 mg by mouth daily.        . fish oil-omega-3 fatty acids 1000 MG capsule Take 1,200 mg by mouth daily.        . hydrochlorothiazide 25 MG tablet Take 25 mg by mouth daily.        Marland Kitchen lisinopril (PRINIVIL,ZESTRIL) 10 MG tablet Take 10 mg by mouth daily.        . metoprolol tartrate (LOPRESSOR) 25 MG tablet TAKE 1 TABLET TWICE A DAY  60 tablet  5    No Known Allergies  Past Medical History  Diagnosis Date  . Coronary artery disease   . Hyperlipidemia   . Tobacco dependence   . Renal cell carcinoma     Past Surgical History  Procedure Date  . Nephrectomy   . Neck surgery   . Cardiovascular stress test 03/22/2009    EF 61%, INFEROSEPTAL WALL DEFECT  . Excision of tumor in back   . Anal fissure repair     History  Smoking status  . Current Everyday Smoker -- 1.0 packs/day  Smokeless tobacco  . Not on file    History  Alcohol Use No    History reviewed. No pertinent family history.  Review of Systems: As noted in history of present illness..  All other systems were reviewed and are negative.  Physical Exam: BP 128/70  Pulse 73  Ht 5\' 11"  (1.803 m)  Wt 226 lb 12.8 oz (102.876 kg)  BMI 31.63 kg/m2 The  patient is alert and oriented x 3.  The mood and affect are normal.  The skin is warm and dry.  Color is normal.  The HEENT exam reveals that the sclera are nonicteric.  The mucous membranes are moist.  The carotids are 2+ without bruits.  There is no thyromegaly.  There is no JVD.  The lungs are clear.  The chest wall is non tender.  The heart exam reveals a regular rate with a normal S1 and S2.  There are no murmurs, gallops, or rubs.  The PMI is not displaced.   Abdominal exam reveals good bowel sounds.  There is no guarding or rebound.  There is no hepatosplenomegaly or tenderness.  There are no masses.  Exam of the legs reveal no clubbing, cyanosis, or edema.  The legs are without rashes.  The distal pulses are intact.  Cranial nerves II - XII are intact.  Motor and sensory functions are intact.  The gait is normal. LABORATORY DATA: ECG demonstrates normal sinus rhythm with a normal ECG.  Assessment /  Plan:

## 2011-05-10 NOTE — Assessment & Plan Note (Signed)
Again counseled him on smoking cessation. He is not ready to quit at this time.

## 2011-05-11 ENCOUNTER — Encounter: Payer: Self-pay | Admitting: Cardiology

## 2011-10-10 ENCOUNTER — Other Ambulatory Visit: Payer: Self-pay | Admitting: *Deleted

## 2011-10-10 MED ORDER — METOPROLOL TARTRATE 25 MG PO TABS: 25.0000 mg | ORAL_TABLET | Freq: Two times a day (BID) | ORAL | Status: DC

## 2013-08-20 ENCOUNTER — Encounter: Payer: Self-pay | Admitting: Emergency Medicine

## 2013-08-20 ENCOUNTER — Emergency Department (INDEPENDENT_AMBULATORY_CARE_PROVIDER_SITE_OTHER)
Admission: EM | Admit: 2013-08-20 | Discharge: 2013-08-20 | Disposition: A | Discharge status: Home / Self Care | Payer: PRIVATE HEALTH INSURANCE | Source: Home / Self Care | Attending: Family Medicine | Admitting: Family Medicine

## 2013-08-20 ENCOUNTER — Emergency Department (INDEPENDENT_AMBULATORY_CARE_PROVIDER_SITE_OTHER): Payer: PRIVATE HEALTH INSURANCE

## 2013-08-20 DIAGNOSIS — J069 Acute upper respiratory infection, unspecified: Secondary | ICD-10-CM

## 2013-08-20 DIAGNOSIS — R918 Other nonspecific abnormal finding of lung field: Secondary | ICD-10-CM

## 2013-08-20 DIAGNOSIS — R05 Cough: Secondary | ICD-10-CM

## 2013-08-20 LAB — POCT CBC W AUTO DIFF (K'VILLE URGENT CARE)

## 2013-08-20 MED ORDER — BENZONATATE 200 MG PO CAPS: 200.0000 mg | ORAL_CAPSULE | Freq: Every day | ORAL | Status: DC

## 2013-08-20 MED ORDER — CEFDINIR 300 MG PO CAPS: 300.0000 mg | ORAL_CAPSULE | Freq: Two times a day (BID) | ORAL | Status: DC

## 2013-08-20 NOTE — ED Provider Notes (Signed)
CSN: [id number]     Arrival date & time 08/20/13  1435 History   First MD Initiated Contact with Patient 08/20/13 1449     Chief Complaint  Patient presents with  . Nasal Congestion  . Cough      HPI Comments: Three days ago patient developed non-productive cough, fatigue, sinus congestion, and myalgias.  He denies sore throat but has had night sweats for two days.  He denies shortness of breath. He has a history of metastatic renal cell CA, and gets a CT scan of chest every 3 months at [facility] (last scan one week ago).  The history is provided by the patient.    Past Medical History  Diagnosis Date  . Coronary artery disease   . Hyperlipidemia   . Tobacco dependence   . Renal cell carcinoma    Past Surgical History  Procedure Laterality Date  . Nephrectomy    . Neck surgery    . Cardiovascular stress test  03/22/2009    EF 61%, INFEROSEPTAL WALL DEFECT  . Excision of tumor in back    . Anal fissure repair     History reviewed. No pertinent family history. History  Substance Use Topics  . Smoking status: Current Every Day Smoker -- 1.00 packs/day  . Smokeless tobacco: Not on file  . Alcohol Use: No    Review of Systems No sore throat + cough No pleuritic pain; has tightness in anterior chest No wheezing + nasal congestion + post-nasal drainage No sinus pain/pressure No itchy/red eyes No earache No hemoptysis No SOB + fever, + chills No nausea No vomiting No abdominal pain No diarrhea No urinary symptoms No skin rash + fatigue No myalgias No headache Used OTC meds without relief  Allergies  Review of patient's allergies indicates no known allergies.  Home Medications   Current Outpatient Rx  Name  Route  Sig  Dispense  Refill  . amLODipine (NORVASC) 5 MG tablet   Oral   Take 5 mg by mouth daily.         . pantoprazole (PROTONIX) 40 MG tablet   Oral   Take 40 mg by mouth daily.         Marland Kitchen aspirin 81 MG tablet   Oral   Take 81 mg by mouth  daily.           Marland Kitchen atorvastatin (LIPITOR) 40 MG tablet   Oral   Take 40 mg by mouth daily.           . benzonatate (TESSALON) 200 MG capsule   Oral   Take 1 capsule (200 mg total) by mouth at bedtime. Take as needed for cough   12 capsule   0   . cefdinir (OMNICEF) 300 MG capsule   Oral   Take 1 capsule (300 mg total) by mouth 2 (two) times daily.   20 capsule   0   . everolimus (AFINITOR) 10 MG tablet   Oral   Take 10 mg by mouth daily.           . fenofibrate (TRICOR) 145 MG tablet   Oral   Take 145 mg by mouth daily.           . fish oil-omega-3 fatty acids 1000 MG capsule   Oral   Take 1,200 mg by mouth daily.           . hydrochlorothiazide 25 MG tablet   Oral   Take 25 mg by mouth daily.           Marland Kitchen  lisinopril (PRINIVIL,ZESTRIL) 10 MG tablet   Oral   Take 10 mg by mouth daily.           . metoprolol tartrate (LOPRESSOR) 25 MG tablet   Oral   Take 1 tablet (25 mg total) by mouth 2 (two) times daily.   60 tablet   5    BP 159/85  Pulse 77  Temp(Src) 98.1 F (36.7 C) (Oral)  Resp 18  Ht 5\' 11"  (1.803 m)  Wt 218 lb (98.884 kg)  BMI 30.42 kg/m2  SpO2 97% Physical Exam Nursing notes and Vital Signs reviewed. Appearance:  Patient appears stated age, and in no acute distress.  Patient is obese (BMI 30.4) Eyes:  Pupils are equal, round, and reactive to light and accomodation.  Extraocular movement is intact.  Conjunctivae are not inflamed  Ears:  Canals normal.  Tympanic membranes normal.  Nose:  Mildly congested turbinates.  No sinus tenderness.   Pharynx:  Normal Neck:  Supple.  Nontender shotty posterior nodes are palpated bilaterally  Lungs:   Bilateral rhonchi at bases.  Breath sounds are equal.  Heart:  Regular rate and rhythm without murmurs, rubs, or gallops.  Abdomen:  Nontender without masses or hepatosplenomegaly.  Bowel sounds are present.  No CVA or flank tenderness.  Extremities:  No edema.  No calf tenderness Skin:  No rash  present.   ED Course  Procedures  none    Labs Reviewed  POCT CBC W AUTO DIFF ([facility]) WBC 3.9; LY 41.4; MO 15.3; GR 43.3; Hgb 9.7; Platelets 127    Imaging Review Dg Chest 2 View  08/20/2013   CLINICAL DATA:  Flu-like symptoms, persistent cough and history of metastatic renal cell carcinoma.  EXAM: CHEST  2 VIEW  COMPARISON:  Chest x-ray 04/14/2011 and chest CT 10/21/2010.  FINDINGS: The cardiac silhouette, mediastinal and hilar contours are within normal limits and stable. There is a age rounded density in the right mid lung suspicious for a possible pulmonary nodule. On the prior chest CT they were right-sided pleural or subpleural nodules is possible these has enlarged. Followup chest CT with contrast may be helpful for further evaluation.  IMPRESSION: Suspect enlarging pleural-based nodule or nodules in the right chest. Chest CT with contrast is recommended for further evaluation.   Electronically Signed   By: Loralie Champagne M.D.   On: 08/20/2013 16:09      MDM   1. Acute upper respiratory infections of unspecified site; history of metastatic renal cell CA    Begin empiric Omnicef.  Prescription written for Benzonatate [facility]) to take at bedtime for night-time cough.  Take plain Mucinex (1200 mg guaifenesin) twice daily for cough and congestion.  Increase fluid intake, rest. May use Afrin nasal spray (or generic oxymetazoline) twice daily for about 5 days.  Also recommend using saline nasal spray several times daily and saline nasal irrigation (AYR is a common brand) Try warm salt water gargles for sore throat.  Stop all antihistamines for now, and other non-prescription cough/cold preparations.   Follow-up with family doctor/oncologist if not improving 7 to 10 days.    Lattie Haw, MD 08/25/13 [phone]

## 2013-08-20 NOTE — ED Notes (Signed)
Pt c/o nasal congestion, cough, decreased appetite, runny nose and night sweats x 3 days. Denies fever.

## 2014-01-09 ENCOUNTER — Encounter: Payer: Self-pay | Admitting: Emergency Medicine

## 2014-01-09 ENCOUNTER — Emergency Department
Admission: EM | Admit: 2014-01-09 | Discharge: 2014-01-09 | Disposition: A | Discharge status: Home / Self Care | Payer: PRIVATE HEALTH INSURANCE | Source: Home / Self Care | Attending: Emergency Medicine | Admitting: Emergency Medicine

## 2014-01-09 ENCOUNTER — Emergency Department (HOSPITAL_COMMUNITY)
Admission: EM | Admit: 2014-01-09 | Discharge: 2014-01-09 | Disposition: A | Discharge status: Home / Self Care | Payer: PRIVATE HEALTH INSURANCE | Attending: Emergency Medicine | Admitting: Emergency Medicine

## 2014-01-09 ENCOUNTER — Emergency Department (HOSPITAL_COMMUNITY): Payer: PRIVATE HEALTH INSURANCE

## 2014-01-09 ENCOUNTER — Encounter (HOSPITAL_COMMUNITY): Payer: Self-pay | Admitting: Emergency Medicine

## 2014-01-09 ENCOUNTER — Emergency Department (INDEPENDENT_AMBULATORY_CARE_PROVIDER_SITE_OTHER): Payer: PRIVATE HEALTH INSURANCE

## 2014-01-09 DIAGNOSIS — R0602 Shortness of breath: Secondary | ICD-10-CM | POA: Insufficient documentation

## 2014-01-09 DIAGNOSIS — R222 Localized swelling, mass and lump, trunk: Secondary | ICD-10-CM | POA: Insufficient documentation

## 2014-01-09 DIAGNOSIS — R1011 Right upper quadrant pain: Secondary | ICD-10-CM

## 2014-01-09 DIAGNOSIS — J948 Other specified pleural conditions: Secondary | ICD-10-CM

## 2014-01-09 DIAGNOSIS — Z85528 Personal history of other malignant neoplasm of kidney: Secondary | ICD-10-CM | POA: Insufficient documentation

## 2014-01-09 DIAGNOSIS — Z7982 Long term (current) use of aspirin: Secondary | ICD-10-CM | POA: Insufficient documentation

## 2014-01-09 DIAGNOSIS — Z79899 Other long term (current) drug therapy: Secondary | ICD-10-CM | POA: Insufficient documentation

## 2014-01-09 DIAGNOSIS — I2510 Atherosclerotic heart disease of native coronary artery without angina pectoris: Secondary | ICD-10-CM | POA: Insufficient documentation

## 2014-01-09 DIAGNOSIS — R079 Chest pain, unspecified: Secondary | ICD-10-CM | POA: Insufficient documentation

## 2014-01-09 DIAGNOSIS — E785 Hyperlipidemia, unspecified: Secondary | ICD-10-CM | POA: Insufficient documentation

## 2014-01-09 DIAGNOSIS — C649 Malignant neoplasm of unspecified kidney, except renal pelvis: Secondary | ICD-10-CM

## 2014-01-09 DIAGNOSIS — F17200 Nicotine dependence, unspecified, uncomplicated: Secondary | ICD-10-CM | POA: Insufficient documentation

## 2014-01-09 DIAGNOSIS — R918 Other nonspecific abnormal finding of lung field: Secondary | ICD-10-CM

## 2014-01-09 LAB — POCT CBC W AUTO DIFF (K'VILLE URGENT CARE)

## 2014-01-09 LAB — COMPREHENSIVE METABOLIC PANEL
ALT: 29 U/L (ref 0–53)
AST: 39 U/L — AB (ref 0–37)
Albumin: 3.9 g/dL (ref 3.5–5.2)
Alkaline Phosphatase: 57 U/L (ref 39–117)
BUN: 24 mg/dL — ABNORMAL HIGH (ref 6–23)
CALCIUM: 9.8 mg/dL (ref 8.4–10.5)
CO2: 24 meq/L (ref 19–32)
CREATININE: 1.72 mg/dL — AB (ref 0.50–1.35)
Chloride: 104 mEq/L (ref 96–112)
GFR calc Af Amer: 48 mL/min — ABNORMAL LOW (ref >90)
GFR, EST NON AFRICAN AMERICAN: 41 mL/min — AB (ref >90)
Glucose, Bld: 90 mg/dL (ref 70–99)
Potassium: 4 mEq/L (ref 3.7–5.3)
SODIUM: 142 meq/L (ref 137–147)
TOTAL PROTEIN: 7.7 g/dL (ref 6.0–8.3)
Total Bilirubin: 0.3 mg/dL (ref 0.3–1.2)

## 2014-01-09 LAB — POCT URINALYSIS DIP (MANUAL ENTRY)
Bilirubin, UA: NEGATIVE
Glucose, UA: NEGATIVE
Ketones, POC UA: NEGATIVE
Leukocytes, UA: NEGATIVE
Nitrite, UA: NEGATIVE
Protein Ur, POC: 30
Spec Grav, UA: 1.025
Urobilinogen, UA: 0.2
pH, UA: 6

## 2014-01-09 MED ORDER — IOHEXOL 350 MG/ML SOLN
100.0000 mL | Freq: Once | INTRAVENOUS | Status: AC | PRN
  Administered 2014-01-09: 80 mL via INTRAVENOUS

## 2014-01-09 MED ORDER — IOHEXOL 300 MG/ML  SOLN
50.0000 mL | Freq: Once | INTRAMUSCULAR | Status: AC | PRN
  Administered 2014-01-09: 50 mL via ORAL

## 2014-01-09 MED ORDER — SODIUM CHLORIDE 0.9 % IV BOLUS (SEPSIS)
500.0000 mL | Freq: Once | INTRAVENOUS | Status: AC
  Administered 2014-01-09: 500 mL via INTRAVENOUS

## 2014-01-09 MED ORDER — DOCUSATE SODIUM 100 MG PO CAPS: 100.0000 mg | ORAL_CAPSULE | Freq: Two times a day (BID) | ORAL | Status: DC

## 2014-01-09 MED ORDER — OXYCODONE-ACETAMINOPHEN 5-325 MG PO TABS
1.0000 | ORAL_TABLET | Freq: Four times a day (QID) | ORAL | Status: DC | PRN
Start: 2014-01-09 — End: 2015-08-30

## 2014-01-09 NOTE — ED Notes (Signed)
Reports onset of intermittent pain in specific area of right lateral rib cage about 10 days ago. Denies nausea, vomiting, diarrhea. Is currently being followed by oncology [facility] and was instructed by them to get evaluation at their local UC.

## 2014-01-09 NOTE — Discharge Instructions (Signed)
Metastatic Cancer, Questions and Answers KEY POINTS  Cancer happens when cells become abnormal and grow without control.  Where the cancer started is called the primary cancer or the primary tumor.  Metastatic cancer happens when cancer cells spread from the place where it started to other parts of the body.  When cancer spreads, the metastatic cancer keeps the same type of cells and the same name as the primary tumor.  The most common sites of metastasis are the lungs, bones, liver, and brain.  Treatment for metastatic cancer usually depends on the type of cancer. It also depends on the size and location of the metastasis. WHAT IS CANCER?   Cancer is a group of many related diseases. All cancers begin in cells. Cells are the building blocks that make up tissues. Cancer that arises from organs and solid tissues is called a solid tumor. Cancer that begins in blood cells is called leukemia, multiple myeloma, or lymphoma.  Normally, cells grow and divide to form new cells as the body needs them. When cells grow old and die, new cells take their place. Sometimes this orderly process goes wrong. New cells form when the body does not need them. Old cells do not die when they should.  The extra cells form a mass of tissue. This is called a growth or tumor. Tumors can be either not cancerous (benign) or cancerous (malignant). Benign tumors do not spread to other parts of the body. They are rarely a threat to life. Malignant tumors can spread (metastasize) and may be life threatening. WHAT IS PRIMARY CANCER?  Cancer can begin in any organ or tissue of the body. The original tumor is called the primary cancer or primary tumor. It is usually named for the part of the body or the type of cell in which it begins. WHAT IS METASTASIS, AND HOW DOES IT HAPPEN?   Metastasis means the spread of cancer. Cancer cells can break away from a primary tumor and enter the bloodstream or lymphatic system. This is the  system that produces, stores, and carries the cells that fight infections. That is how cancer cells spread to other parts of the body.  When cancer cells spread and form a new tumor in a different organ, the new tumor is a metastatic tumor. The cells in the metastatic tumor come from the original tumor. For example, if breast cancer spreads to the lungs, the metastatic tumor in the lung is made up of cancerous breast cells. It is not made of lung cells. In this case, the disease in the lungs is metastatic breast cancer (not lung cancer). Under a microscope, metastatic breast cancer cells generally look the same as the cancer cells in the breast. [place]?   Cancer cells can spread to almost any part of the body. Cancer cells frequently spread to lymph nodes (rounded masses of lymphatic tissue) near the primary tumor (regional lymph nodes). This is called lymph node involvement or regional disease. Cancer that spreads to other organs or to lymph nodes far from the primary tumor is called metastatic disease. Caregivers sometimes also call this distant disease.  The most common sites of metastasis from solid tumors are the lungs, bones, liver, and brain. Some cancers tend to spread to certain parts of the body. For example, lung cancer often metastasizes to the brain or bones. Colon cancer often spreads to the liver. Prostate cancer tends to spread to the bones. Breast cancer commonly spreads to the bones, lungs, liver,  or brain. But each of these cancers can spread to other parts of the body as well.  Because blood cells travel throughout the body, leukemia, multiple myeloma, and lymphoma cells are usually not localized when the cancer is diagnosed. Tumor cells may be found in the blood, several lymph nodes, or other parts of the body such as the liver or bones. This type of spread is not referred to as metastasis. ARE THERE SYMPTOMS OF METASTATIC CANCER?   Some people with metastatic  cancer do not have symptoms. Their metastases are found by X-rays and other tests performed for other reasons.  When symptoms of metastatic cancer occur, the type and frequency of the symptoms will depend on the size and location of the metastasis. For example, cancer that spreads to the bones is likely to cause pain and can lead to bone fractures. Cancer that spreads to the brain can cause a variety of symptoms. These include headaches, seizures, and unsteadiness. Shortness of breath may be a sign of lung involvement. Abdominal swelling or yellowing of the skin (jaundice) can indicate that cancer has spread to the liver.  Sometimes a person's primary cancer is discovered only after the metastatic tumor causes symptoms. For example, a man whose prostate cancer has spread to the bones in his pelvis may have lower back pain (caused by the cancer in his bones) before he experiences any symptoms from the primary tumor in his prostate. HOW DOES THE CAREGIVER KNOW WHETHER A CANCER IS PRIMARY OR A METASTATIC TUMOR?  To determine whether a tumor is primary or metastatic, the tumor will be examined under a microscope. In general, cancer cells look like abnormal versions of cells in the tissue where the cancer began. Using specialized diagnostic tests, a trained person is often able to tell where the cancer cells came from. Markers or antigens found in or on the cancer cells can indicate the primary site of the cancer.  Metastatic cancers may be found before or at the same time as the primary tumor, or months or years later. When a new tumor is found in a patient who has been treated for cancer in the past, it is more often a metastasis than another primary tumor. IS IT POSSIBLE TO HAVE A METASTATIC TUMOR WITHOUT HAVING A PRIMARY CANCER?  No. A metastatic tumor always starts from cancer cells in another part of the body. In most cases, when a metastatic tumor is found first, the primary tumor can be found. The  search for the primary tumor may involve lab tests, X-rays, and other procedures. However, in a small number of cases, a metastatic tumor is diagnosed but the primary tumor cannot be found, in spite of extensive tests. The tumor is metastatic because the cells are not like those in the organ or tissue in which the tumor is found. The primary tumor is called unknown or hidden (occult). The patient is said to have cancer of unknown primary origin (CUP). Because diagnostic techniques are constantly improving, the number of cases of CUP is going down.  WHAT TREATMENTS ARE USED FOR METASTATIC CANCER?   When cancer has metastasized, it may be treated with:  Chemotherapy.  Radiation therapy.  Biological therapy.  Hormone therapy.  Surgery.  Cryosurgery.  A combination of these.  The choice of treatment generally depends on the:  Type of primary cancer.  Size and location of the metastasis.  Patient's age and general health.  Types of treatments the patient has had in the past. In   patients with CUP, it is possible to treat the disease even though the primary tumor has not been located. The goal of treatment may be to control the cancer, or to relieve symptoms or side effects of treatment. ARE NEW TREATMENTS FOR METASTATIC CANCER BEING DEVELOPED?  Yes, many new cancer treatments are under study. To develop new treatments, the NCI sponsors clinical trials (research studies) with cancer patients in many hospitals, universities, medical schools, and cancer centers around the country. Clinical trials are a critical step in the improvement of treatment. Before any new treatment can be recommended for general use, doctors conduct studies to find out whether the treatment is both safe for patients and effective against the disease. The results of such studies have led to progress not only in the treatment of cancer, but in the detection, diagnosis, and prevention of the disease as well. Patients  interested in taking part in a clinical trial should talk with their caregivers. FOR MORE INFORMATION National Cancer Institute (NCI): www.cancer.gov Document Released: 12/26/2004 Document Revised: 11/13/2011 Document Reviewed: 08/13/2008 [employer] Patient Information 2014 [employer].  

## 2014-01-09 NOTE — ED Notes (Signed)
He c/o right lat. Thoracic/abd/flank area pain x ~ 1 1/2 weeks.  He saw Dr. Burnett Harry, who perfirmed testing, including x-ray which showed possible mass rll lung area.  He is in no distress.

## 2014-01-09 NOTE — ED Provider Notes (Addendum)
CSN: [id number]     Arrival date & time 01/09/14  1213 History   First MD Initiated Contact with Patient 01/09/14 [facility]  R Flank Pain Patient is a 63 y.o. male presenting with flank pain. The history is provided by the patient and the spouse.  Flank Pain This is a new problem. Episode onset: 10 days. The problem occurs constantly. The problem has been gradually worsening. Associated symptoms include chest pain (right posterior lateral chest/rib pain) and abdominal pain (right upper quadrant). Pertinent negatives include no headaches and no shortness of breath. Nothing aggravates the symptoms. Nothing relieves the symptoms. Treatments tried: position change. The treatment provided no relief.   Reports onset of gradually worsening sharp,gnawing pain in right postero- lateral rib cage about 10 days ago. Pain intensity 8/10, unrelated to position or food or exertion. No known trauma or injury. Denies nausea, vomiting, diarrhea, fever, chills, anorexia, exertional chest pain, exertional shortness of breath, dysuria, hematuria, or focal neurologic weakness or sensory change. After further questioning, he admits to minimal chronic cough, and minimal shortness of breath with exertion, but he states that has been chronic for years and unchanged. Past medical history of R renal cell carcinoma, right nephrectomy 2011 and pulmonary nodules.-He reports surgery for these in 2011.  Is currently being followed by oncology [facility]. He and wife called [facility] oncology today and were instructed by them to get evaluation at their local urgent care. He and wife state that he was last evaluated with follow-up CT chest at [facility] oncology two months ago and that they were told that "everything was stable and doing well".  Also, per patient and wife, history of incidental gallstones seen on prior imaging.  No prior history of gallbladder or appendix surgery. Positive surgical history for abdominal hernia  repair.  Past Medical History  Diagnosis Date  . Coronary artery disease   . Hyperlipidemia   . Tobacco dependence   . Renal cell carcinoma    Past Surgical History  Procedure Laterality Date  . Nephrectomy    . Neck surgery    . Cardiovascular stress test  03/22/2009    EF 61%, INFEROSEPTAL WALL DEFECT  . Excision of tumor in back    . Anal fissure repair     History reviewed. No pertinent family history. History  Substance Use Topics  . Smoking status: Current Every Day Smoker -- 1.00 packs/day  . Smokeless tobacco: Not on file  . Alcohol Use: No    Review of Systems  Respiratory: Negative for shortness of breath.   Cardiovascular: Positive for chest pain (right posterior lateral chest/rib pain).  Gastrointestinal: Positive for abdominal pain (right upper quadrant).  Genitourinary: Positive for flank pain.  Neurological: Negative for headaches.  All other systems reviewed and are negative.   Allergies  Review of patient's allergies indicates no known allergies.  Home Medications   Prior to Admission medications   Medication Sig Start Date End Date Taking? Authorizing Provider  pravastatin (PRAVACHOL) 40 MG tablet Take 40 mg by mouth daily.   Yes Historical Provider, MD  amLODipine (NORVASC) 5 MG tablet Take 5 mg by mouth daily.    Historical Provider, MD  aspirin 81 MG tablet Take 81 mg by mouth daily.      Historical Provider, MD  atorvastatin (LIPITOR) 40 MG tablet Take 40 mg by mouth daily.      Historical Provider, MD  benzonatate (TESSALON) 200 MG capsule Take 1 capsule (200 mg total)  by mouth at bedtime. Take as needed for cough 08/20/13   Kandra Nicolas, MD  cefdinir (OMNICEF) 300 MG capsule Take 1 capsule (300 mg total) by mouth 2 (two) times daily. 08/20/13   Kandra Nicolas, MD  everolimus (AFINITOR) 10 MG tablet Take 10 mg by mouth daily.      Historical Provider, MD  fenofibrate (TRICOR) 145 MG tablet Take 145 mg by mouth daily.      Historical Provider,  MD  fish oil-omega-3 fatty acids 1000 MG capsule Take 1,200 mg by mouth daily.      Historical Provider, MD  hydrochlorothiazide 25 MG tablet Take 25 mg by mouth daily.      Historical Provider, MD  lisinopril (PRINIVIL,ZESTRIL) 10 MG tablet Take 10 mg by mouth daily.      Historical Provider, MD  metoprolol tartrate (LOPRESSOR) 25 MG tablet Take 1 tablet (25 mg total) by mouth 2 (two) times daily. 10/10/11   Peter M Martinique, MD  pantoprazole (PROTONIX) 40 MG tablet Take 40 mg by mouth daily.    Historical Provider, MD   BP 123/72  Pulse 66  Temp(Src) 98.6 F (37 C) (Oral)  Resp 16  Ht 5\' 11"  (1.803 m)  Wt 213 lb (96.616 kg)  BMI 29.72 kg/m2  SpO2 98% Physical Exam  Nursing note and vitals reviewed. Constitutional: He is oriented to person, place, and time. He appears well-developed and well-nourished. No distress.  Here with wife. Alert, cooperative, male, who is ambulatory, ambulates without assistance. Appears moderately uncomfortable. No acute cardiorespiratory distress. Does not appear toxic.  HENT:  Head: Normocephalic and atraumatic.  Mouth/Throat: Oropharynx is clear and moist.  Eyes: Conjunctivae and EOM are normal. Pupils are equal, round, and reactive to light. No scleral icterus.  Neck: Normal range of motion. Neck supple. No JVD present. No tracheal deviation present.  Cardiovascular: Normal rate, regular rhythm and normal heart sounds.  Exam reveals no gallop and no friction rub.   No murmur heard. Pulmonary/Chest: Effort normal.  Abdominal: Soft. He exhibits no distension and no mass. There is no tenderness. There is no rebound and no guarding.  Musculoskeletal: Normal range of motion.  Mildly tender right posterior lateral ribs/flank without deformity or acute skin abnormality. He states that the pain is severe and deep and not on the surface.  No calf swelling or tenderness or cords. Negative homans sign  Lymphadenopathy:    He has no cervical adenopathy.   Neurological: He is alert and oriented to person, place, and time. No cranial nerve deficit or sensory deficit. He exhibits normal muscle tone.  Skin: Skin is warm. No rash noted.  Psychiatric: He has a normal mood and affect.    ED Course  Procedures (including critical care time) Labs Review Labs Reviewed  POCT CBC W AUTO DIFF ([place])  POCT URINALYSIS DIP (MANUAL ENTRY)    Results for orders placed during the hospital encounter of 01/09/14  POCT CBC W AUTO DIFF ([facility])      Result Value Ref Range   WBC    4.5 - 10.5 K/uL   Lymphocytes relative %    15 - 45 %   Monocytes relative %    2 - 10 %   Neutrophils relative % (GR)    44 - 76 %   Lymphocytes absolute    0.1 - 1.8 K/uL   Monocyes absolute    0.1 - 1 K/uL   Neutrophils absolute (GR#)  1.7 - 7.7 K/uL   RBC    4.2 - 5.8 MIL/uL   Hemoglobin    13 - 17 g/dL   Hematocrit    38.5 - 51 %   MCV    80 - 98 fL   MCH    26.5 - 32.5 pg   MCHC    32.5 - 36.9 g/dL   RDW    11.6 - 14 %   Platelet count    140 - 400 K/uL   MPV    7.8 - 11 fL  POCT URINALYSIS DIP (MANUAL ENTRY)      Result Value Ref Range   Color, UA yellow     Clarity, UA clear     Glucose, UA neg     Bilirubin, UA negative     Bilirubin, UA negative     Spec Grav, UA 1.025     Blood, UA trace-intact     pH, UA 6.0     Protein Ur, POC =30     Urobilinogen, UA 0.2     Nitrite, UA Negative     Leukocytes, UA Negative       Imaging Review  Dg Ribs Unilateral W/chest Right  01/09/2014   CLINICAL DATA:  Right lower flank pain for 1 week.  EXAM: RIGHT RIBS AND CHEST - 3+ VIEW  COMPARISON:  08/20/2013  FINDINGS: No fracture or other bone lesions are seen involving the ribs. There is no evidence of pneumothorax or pleural effusion. Nodular opacities in the right lung base are again noted. These appear increased in conspicuity and are suspicious for enlarging pleural based nodules. Heart size and mediastinal contours are within normal  limits.  IMPRESSION: 1. No displaced rib fractures. 2. Suspect enlarging pleural-based nodule or nodules in the right chest. CT chest with contrast is recommended for further evaluation.   Electronically Signed   By: Kerby Moors M.D.   On: 01/09/2014 14:19      MDM  Right upper quadrant/right flank pain. Moderate to severe. No known injury or trauma. Uncertain of diagnosis . Suspect this is related to his prior renal cell carcinoma and enlarging pleural-based nodules in the right chest. Microcytic anemia. WBC count within normal limits .  Also in the differential is gallbladder/gallstones, kidney stones, and other possible causes   Urinalysis shows trace blood, 30 mg per DL protein.--- Negative for glucose, bilirubin, ketones, nitrates, leukocytes. Hemoglobin 10.6.  MCV 74.4 WBC 4.0, platelets 163,000 Reviewed with patient and wife : X-ray right ribs: No fracture or bone lesions or abnormalities of the ribs noted.  Suspect enlarging pleural-based nodules in the right chest.  Explained to patient and wife. I explained that I do not have a definitive diagnosis, but that in my opinion, he needs evaluation at a hospital emergency department where more definitive testing and treatment can be done. He'll likely need CT chest with contrast.  His pain is moderate to severe, but he declined IM Toradol at this time.  After risks, benefits, alternatives discussed, patient and wife agree with my recommendation to go to [place] for more definitive evaluation and treatment. Patient declined ambulance transport, and wife is driving him to [place] ER now.  Over 45 minutes spent, greater than 50% of the time spent for counseling and coordination of care.  Jacqulyn Cane, MD 01/10/14 2030  Jacqulyn Cane, MD 01/10/14 2034

## 2014-01-09 NOTE — ED Provider Notes (Signed)
Medical screening examination/treatment/procedure(s) were performed by non-physician practitioner and as supervising physician I was immediately available for consultation/collaboration.   EKG Interpretation None        Charles B. Karle Starch, MD 01/09/14 2158

## 2014-01-09 NOTE — ED Provider Notes (Signed)
CSN: [id number]     Arrival date & time 01/09/14  1512 History   First MD Initiated Contact with Patient 01/09/14 1524     Chief Complaint  Patient presents with  . Abdominal Pain     (Consider location/radiation/quality/duration/timing/severity/associated sxs/prior Treatment) Patient is a 63 y.o. male presenting with abdominal pain. The history is provided by the patient. No language interpreter was used.  Abdominal Pain Pain location:  R flank and RUQ Pain quality: gnawing and sharp   Pain severity:  Moderate Duration:  10 days Timing:  Intermittent Progression:  Waxing and waning Chronicity:  New Ineffective treatments:  Position changes Associated symptoms: chest pain and shortness of breath   Associated symptoms: no fever   Chest pain:    Quality:  Sharp   Severity:  Moderate   Onset quality:  Sudden   Duration:  10 days   Timing:  Intermittent   Chronicity:  New Shortness of breath:    Severity:  Mild   Progression:  Waxing and waning Risk factors comment:  Renal carcinoma, pulmonary nodules   Past Medical History  Diagnosis Date  . Coronary artery disease   . Hyperlipidemia   . Tobacco dependence   . Renal cell carcinoma    Past Surgical History  Procedure Laterality Date  . Nephrectomy    . Neck surgery    . Cardiovascular stress test  03/22/2009    EF 61%, INFEROSEPTAL WALL DEFECT  . Excision of tumor in back    . Anal fissure repair     History reviewed. No pertinent family history. History  Substance Use Topics  . Smoking status: Current Every Day Smoker -- 1.00 packs/day  . Smokeless tobacco: Not on file  . Alcohol Use: No    Review of Systems  Constitutional: Negative for fever.  Respiratory: Positive for shortness of breath.   Cardiovascular: Positive for chest pain.  Gastrointestinal: Positive for abdominal pain.  All other systems reviewed and are negative.     Allergies  Review of patient's allergies indicates no known  allergies.  Home Medications   Prior to Admission medications   Medication Sig Start Date End Date Taking? Authorizing Provider  amLODipine (NORVASC) 5 MG tablet Take 5 mg by mouth daily.   Yes Historical Provider, MD  aspirin 81 MG tablet Take 81 mg by mouth daily.     Yes Historical Provider, MD  everolimus (AFINITOR) 10 MG tablet Take 10 mg by mouth daily.     Yes Historical Provider, MD  fenofibrate (TRICOR) 145 MG tablet Take 145 mg by mouth daily.     Yes Historical Provider, MD  fish oil-omega-3 fatty acids 1000 MG capsule Take 1,200 mg by mouth daily.     Yes Historical Provider, MD  metoprolol tartrate (LOPRESSOR) 25 MG tablet Take 1 tablet (25 mg total) by mouth 2 (two) times daily. 10/10/11  Yes Peter M Martinique, MD  pravastatin (PRAVACHOL) 40 MG tablet Take 40 mg by mouth daily.   Yes Historical Provider, MD   BP 136/83  Pulse 70  Temp(Src) 98.3 F (36.8 C) (Oral)  Resp 20  SpO2 98% Physical Exam  Nursing note and vitals reviewed. Constitutional: He is oriented to person, place, and time. He appears well-developed and well-nourished.  HENT:  Head: Normocephalic.  Eyes: Pupils are equal, round, and reactive to light.  Neck: Normal range of motion.  Cardiovascular: Normal rate and regular rhythm.   Pulmonary/Chest: Effort normal and breath sounds normal. He exhibits tenderness.  Abdominal: Soft. He exhibits no distension.  Musculoskeletal: He exhibits no edema and no tenderness.  Lymphadenopathy:    He has no cervical adenopathy.  Neurological: He is alert and oriented to person, place, and time.  Skin: Skin is warm and dry.  Psychiatric: He has a normal mood and affect. His behavior is normal. Judgment and thought content normal.    ED Course  Procedures (including critical care time) Labs Review Labs Reviewed  COMPREHENSIVE METABOLIC PANEL    Imaging Review Dg Ribs Unilateral W/chest Right  01/09/2014   CLINICAL DATA:  Right lower flank pain for 1 week.  EXAM:  RIGHT RIBS AND CHEST - 3+ VIEW  COMPARISON:  08/20/2013  FINDINGS: No fracture or other bone lesions are seen involving the ribs. There is no evidence of pneumothorax or pleural effusion. Nodular opacities in the right lung base are again noted. These appear increased in conspicuity and are suspicious for enlarging pleural based nodules. Heart size and mediastinal contours are within normal limits.  IMPRESSION: 1. No displaced rib fractures. 2. Suspect enlarging pleural-based nodule or nodules in the right chest. CT chest with contrast is recommended for further evaluation.   Electronically Signed   By: Kerby Moors M.D.   On: 01/09/2014 14:19     EKG Interpretation None     3:52 PM Patient with history of renal carcinoma, right nephrectomy, followed by heme/onc at [facility].  Patient reports 10 day history of right lower chest wall, right flank, and RUQ pain that has been unremitting.  Patient in contact with heme/onc today, recommended urgent care visit to check labs and obtain chest film.  Plain film reveals increased conspicuity suspicious for enlarging pleural nodules. Discussed with Dr. Karle Starch, will obtain chemistry labs as well as CTA of chest and CT A/P with contrast. Spoke with Armando Gang, NP with heme/oncology at [facility] and shared current results and diagnostic plan--will recontact when results obtained.   Results reviewed and shared with patient and family.  Again discussed with NP Dunn, who will facilitate patient's follow-up in the clinic on Monday.  MDM   Final diagnoses:  None    Enlarging pleural mass in patient with primary renal carcinoma.        Norman Herrlich, NP 01/09/14 2045

## 2015-01-08 ENCOUNTER — Other Ambulatory Visit: Payer: Self-pay | Admitting: Orthopaedic Surgery

## 2015-01-08 DIAGNOSIS — M25512 Pain in left shoulder: Secondary | ICD-10-CM

## 2015-01-26 ENCOUNTER — Ambulatory Visit
Admission: RE | Admit: 2015-01-26 | Discharge: 2015-01-26 | Disposition: A | Discharge status: Home / Self Care | Payer: PRIVATE HEALTH INSURANCE | Source: Ambulatory Visit | Attending: Orthopaedic Surgery | Admitting: Orthopaedic Surgery

## 2015-01-26 DIAGNOSIS — M25512 Pain in left shoulder: Secondary | ICD-10-CM

## 2015-08-30 ENCOUNTER — Encounter (HOSPITAL_COMMUNITY): Payer: Self-pay | Admitting: Family Medicine

## 2015-08-30 ENCOUNTER — Inpatient Hospital Stay (HOSPITAL_COMMUNITY): Payer: PRIVATE HEALTH INSURANCE

## 2015-08-30 ENCOUNTER — Inpatient Hospital Stay (HOSPITAL_COMMUNITY)
Admission: EM | Admit: 2015-08-30 | Discharge: 2015-09-01 | DRG: 683 | Disposition: A | Discharge status: Home / Self Care | Payer: PRIVATE HEALTH INSURANCE | Attending: Internal Medicine | Admitting: Internal Medicine

## 2015-08-30 ENCOUNTER — Emergency Department (HOSPITAL_COMMUNITY): Payer: PRIVATE HEALTH INSURANCE

## 2015-08-30 DIAGNOSIS — Z905 Acquired absence of kidney: Secondary | ICD-10-CM | POA: Diagnosis not present

## 2015-08-30 DIAGNOSIS — N1330 Unspecified hydronephrosis: Secondary | ICD-10-CM | POA: Diagnosis present

## 2015-08-30 DIAGNOSIS — C7801 Secondary malignant neoplasm of right lung: Secondary | ICD-10-CM

## 2015-08-30 DIAGNOSIS — E039 Hypothyroidism, unspecified: Secondary | ICD-10-CM | POA: Diagnosis present

## 2015-08-30 DIAGNOSIS — C649 Malignant neoplasm of unspecified kidney, except renal pelvis: Secondary | ICD-10-CM

## 2015-08-30 DIAGNOSIS — F17200 Nicotine dependence, unspecified, uncomplicated: Secondary | ICD-10-CM | POA: Diagnosis present

## 2015-08-30 DIAGNOSIS — I10 Essential (primary) hypertension: Secondary | ICD-10-CM | POA: Diagnosis not present

## 2015-08-30 DIAGNOSIS — I129 Hypertensive chronic kidney disease with stage 1 through stage 4 chronic kidney disease, or unspecified chronic kidney disease: Secondary | ICD-10-CM | POA: Diagnosis present

## 2015-08-30 DIAGNOSIS — E861 Hypovolemia: Secondary | ICD-10-CM | POA: Diagnosis present

## 2015-08-30 DIAGNOSIS — Z8249 Family history of ischemic heart disease and other diseases of the circulatory system: Secondary | ICD-10-CM | POA: Diagnosis not present

## 2015-08-30 DIAGNOSIS — R197 Diarrhea, unspecified: Secondary | ICD-10-CM | POA: Insufficient documentation

## 2015-08-30 DIAGNOSIS — C7800 Secondary malignant neoplasm of unspecified lung: Secondary | ICD-10-CM | POA: Diagnosis present

## 2015-08-30 DIAGNOSIS — K529 Noninfective gastroenteritis and colitis, unspecified: Secondary | ICD-10-CM | POA: Diagnosis present

## 2015-08-30 DIAGNOSIS — R112 Nausea with vomiting, unspecified: Secondary | ICD-10-CM | POA: Diagnosis present

## 2015-08-30 DIAGNOSIS — Z79899 Other long term (current) drug therapy: Secondary | ICD-10-CM

## 2015-08-30 DIAGNOSIS — N200 Calculus of kidney: Secondary | ICD-10-CM | POA: Diagnosis present

## 2015-08-30 DIAGNOSIS — I9589 Other hypotension: Secondary | ICD-10-CM | POA: Diagnosis not present

## 2015-08-30 DIAGNOSIS — N182 Chronic kidney disease, stage 2 (mild): Secondary | ICD-10-CM | POA: Diagnosis present

## 2015-08-30 DIAGNOSIS — E785 Hyperlipidemia, unspecified: Secondary | ICD-10-CM | POA: Diagnosis present

## 2015-08-30 DIAGNOSIS — Z7982 Long term (current) use of aspirin: Secondary | ICD-10-CM

## 2015-08-30 DIAGNOSIS — E860 Dehydration: Secondary | ICD-10-CM | POA: Diagnosis present

## 2015-08-30 DIAGNOSIS — I959 Hypotension, unspecified: Secondary | ICD-10-CM | POA: Diagnosis present

## 2015-08-30 DIAGNOSIS — N179 Acute kidney failure, unspecified: Principal | ICD-10-CM

## 2015-08-30 DIAGNOSIS — R109 Unspecified abdominal pain: Secondary | ICD-10-CM

## 2015-08-30 DIAGNOSIS — Z888 Allergy status to other drugs, medicaments and biological substances status: Secondary | ICD-10-CM | POA: Diagnosis not present

## 2015-08-30 DIAGNOSIS — T451X5A Adverse effect of antineoplastic and immunosuppressive drugs, initial encounter: Secondary | ICD-10-CM | POA: Diagnosis present

## 2015-08-30 DIAGNOSIS — N170 Acute kidney failure with tubular necrosis: Secondary | ICD-10-CM | POA: Diagnosis not present

## 2015-08-30 LAB — CBC WITH DIFFERENTIAL/PLATELET
BASOS PCT: 0 %
Basophils Absolute: 0 10*3/uL (ref 0.0–0.1)
EOS PCT: 1 %
Eosinophils Absolute: 0 10*3/uL (ref 0.0–0.7)
HCT: 41.9 % (ref 39.0–52.0)
Hemoglobin: 14.2 g/dL (ref 13.0–17.0)
Lymphocytes Relative: 20 %
Lymphs Abs: 1.4 10*3/uL (ref 0.7–4.0)
MCH: 30.7 pg (ref 26.0–34.0)
MCHC: 33.9 g/dL (ref 30.0–36.0)
MCV: 90.7 fL (ref 78.0–100.0)
MONO ABS: 0.6 10*3/uL (ref 0.1–1.0)
Monocytes Relative: 8 %
NEUTROS ABS: 5 10*3/uL (ref 1.7–7.7)
Neutrophils Relative %: 71 %
PLATELETS: 175 10*3/uL (ref 150–400)
RBC: 4.62 MIL/uL (ref 4.22–5.81)
RDW: 14 % (ref 11.5–15.5)
WBC: 7 10*3/uL (ref 4.0–10.5)

## 2015-08-30 LAB — COMPREHENSIVE METABOLIC PANEL
ALBUMIN: 3.3 g/dL — AB (ref 3.5–5.0)
ALT: 23 U/L (ref 17–63)
AST: 27 U/L (ref 15–41)
Alkaline Phosphatase: 52 U/L (ref 38–126)
Anion gap: 11 (ref 5–15)
BUN: 68 mg/dL — AB (ref 6–20)
CHLORIDE: 104 mmol/L (ref 101–111)
CO2: 21 mmol/L — ABNORMAL LOW (ref 22–32)
CREATININE: 3.68 mg/dL — AB (ref 0.61–1.24)
Calcium: 8.3 mg/dL — ABNORMAL LOW (ref 8.9–10.3)
GFR calc Af Amer: 19 mL/min — ABNORMAL LOW (ref >60)
GFR calc non Af Amer: 16 mL/min — ABNORMAL LOW (ref >60)
GLUCOSE: 87 mg/dL (ref 65–99)
POTASSIUM: 4.5 mmol/L (ref 3.5–5.1)
Sodium: 136 mmol/L (ref 135–145)
Total Bilirubin: 0.9 mg/dL (ref 0.3–1.2)
Total Protein: 5.6 g/dL — ABNORMAL LOW (ref 6.5–8.1)

## 2015-08-30 LAB — C DIFFICILE QUICK SCREEN W PCR REFLEX
C Diff antigen: NEGATIVE
C Diff interpretation: NEGATIVE
C Diff toxin: NEGATIVE

## 2015-08-30 LAB — URINALYSIS, ROUTINE W REFLEX MICROSCOPIC
GLUCOSE, UA: NEGATIVE mg/dL
Hgb urine dipstick: NEGATIVE
Ketones, ur: NEGATIVE mg/dL
Leukocytes, UA: NEGATIVE
Nitrite: NEGATIVE
PH: 5 (ref 5.0–8.0)
Protein, ur: NEGATIVE mg/dL
Specific Gravity, Urine: 1.017 (ref 1.005–1.030)

## 2015-08-30 LAB — CG4 I-STAT (LACTIC ACID): LACTIC ACID, VENOUS: 1.37 mmol/L (ref 0.5–2.0)

## 2015-08-30 LAB — MRSA PCR SCREENING: MRSA by PCR: NEGATIVE

## 2015-08-30 LAB — MAGNESIUM: Magnesium: 1.7 mg/dL (ref 1.7–2.4)

## 2015-08-30 LAB — CREATININE, URINE, RANDOM: Creatinine, Urine: 193.09 mg/dL

## 2015-08-30 LAB — LIPASE, BLOOD: Lipase: 27 U/L (ref 11–51)

## 2015-08-30 LAB — SODIUM, URINE, RANDOM: SODIUM UR: 21 mmol/L

## 2015-08-30 MED ORDER — HEPARIN SODIUM (PORCINE) 5000 UNIT/ML IJ SOLN
5000.0000 [IU] | Freq: Three times a day (TID) | INTRAMUSCULAR | Status: DC
  Administered 2015-08-30 – 2015-09-01 (×6): 5000 [IU] via SUBCUTANEOUS
  Filled 2015-08-30 (×6): qty 1

## 2015-08-30 MED ORDER — SODIUM CHLORIDE 0.9 % IV BOLUS (SEPSIS)
1000.0000 mL | Freq: Once | INTRAVENOUS | Status: AC
  Administered 2015-08-30: 1000 mL via INTRAVENOUS

## 2015-08-30 MED ORDER — PROMETHAZINE HCL 25 MG PO TABS: 12.5000 mg | ORAL_TABLET | Freq: Four times a day (QID) | ORAL | Status: DC | PRN

## 2015-08-30 MED ORDER — SODIUM CHLORIDE 0.9 % IV SOLN
INTRAVENOUS | Status: DC
  Administered 2015-08-30 – 2015-09-01 (×4): via INTRAVENOUS

## 2015-08-30 MED ORDER — AXITINIB 5 MG PO TABS
7.0000 mg | ORAL_TABLET | Freq: Two times a day (BID) | ORAL | Status: DC
  Administered 2015-08-30 – 2015-08-31 (×4): 7 mg via ORAL

## 2015-08-30 MED ORDER — TRAZODONE HCL 50 MG PO TABS
100.0000 mg | ORAL_TABLET | Freq: Every day | ORAL | Status: DC
  Administered 2015-08-30 – 2015-08-31 (×2): 100 mg via ORAL
  Filled 2015-08-30 (×2): qty 2

## 2015-08-30 MED ORDER — DIPHENOXYLATE-ATROPINE 2.5-0.025 MG PO TABS
1.0000 | ORAL_TABLET | Freq: Four times a day (QID) | ORAL | Status: DC | PRN
  Administered 2015-08-31 (×2): 1 via ORAL
  Filled 2015-08-30 (×2): qty 1

## 2015-08-30 MED ORDER — SODIUM CHLORIDE 0.9 % IV SOLN: INTRAVENOUS | Status: DC

## 2015-08-30 MED ORDER — ASPIRIN EC 81 MG PO TBEC
81.0000 mg | DELAYED_RELEASE_TABLET | Freq: Every day | ORAL | Status: DC
  Administered 2015-08-30 – 2015-08-31 (×2): 81 mg via ORAL
  Filled 2015-08-30 (×2): qty 1

## 2015-08-30 MED ORDER — BISMUTH SUBSALICYLATE 262 MG/15ML PO SUSP
30.0000 mL | ORAL | Status: DC | PRN
  Administered 2015-08-30 – 2015-08-31 (×2): 30 mL via ORAL
  Filled 2015-08-30: qty 236

## 2015-08-30 MED ORDER — ONDANSETRON HCL 4 MG/2ML IJ SOLN: 4.0000 mg | Freq: Three times a day (TID) | INTRAMUSCULAR | Status: AC | PRN

## 2015-08-30 MED ORDER — DRONABINOL 5 MG PO CAPS: 5.0000 mg | ORAL_CAPSULE | Freq: Two times a day (BID) | ORAL | Status: DC | PRN

## 2015-08-30 MED ORDER — LEVOTHYROXINE SODIUM 50 MCG PO TABS
50.0000 ug | ORAL_TABLET | Freq: Every day | ORAL | Status: DC
  Administered 2015-08-30 – 2015-09-01 (×3): 50 ug via ORAL
  Filled 2015-08-30 (×2): qty 1

## 2015-08-30 MED ORDER — LOPERAMIDE HCL 2 MG PO CAPS: 2.0000 mg | ORAL_CAPSULE | ORAL | Status: DC | PRN

## 2015-08-30 MED ORDER — SODIUM CHLORIDE 0.9 % IJ SOLN
3.0000 mL | Freq: Two times a day (BID) | INTRAMUSCULAR | Status: DC
  Administered 2015-08-30 – 2015-08-31 (×3): 3 mL via INTRAVENOUS

## 2015-08-30 MED ORDER — IOHEXOL 300 MG/ML  SOLN
50.0000 mL | Freq: Once | INTRAMUSCULAR | Status: AC | PRN
  Administered 2015-08-30: 50 mL via ORAL

## 2015-08-30 MED ORDER — ONDANSETRON HCL 4 MG/2ML IJ SOLN
4.0000 mg | Freq: Once | INTRAMUSCULAR | Status: AC
  Administered 2015-08-30: 4 mg via INTRAVENOUS
  Filled 2015-08-30: qty 2

## 2015-08-30 MED ORDER — PRAVASTATIN SODIUM 20 MG PO TABS
40.0000 mg | ORAL_TABLET | Freq: Every day | ORAL | Status: DC
  Administered 2015-08-30 – 2015-08-31 (×2): 40 mg via ORAL
  Filled 2015-08-30 (×2): qty 2

## 2015-08-30 MED ORDER — DIPHENOXYLATE-ATROPINE 2.5-0.025 MG PO TABS
2.0000 | ORAL_TABLET | Freq: Once | ORAL | Status: AC
  Administered 2015-08-30: 2 via ORAL
  Filled 2015-08-30: qty 2

## 2015-08-30 MED ORDER — SODIUM CHLORIDE 0.9 % IV BOLUS (SEPSIS)
1000.0000 mL | Freq: Once | INTRAVENOUS | Status: AC
Start: 2015-08-30 — End: 2015-08-30
  Administered 2015-08-30: 1000 mL via INTRAVENOUS

## 2015-08-30 MED ORDER — SODIUM CHLORIDE 0.9 % IV BOLUS (SEPSIS)
1000.0000 mL | Freq: Once | INTRAVENOUS | Status: AC
  Administered 2015-08-30 (×2): 1000 mL via INTRAVENOUS

## 2015-08-30 MED ORDER — MIRTAZAPINE 15 MG PO TABS
22.5000 mg | ORAL_TABLET | Freq: Every day | ORAL | Status: DC
  Administered 2015-08-30 – 2015-08-31 (×2): 22.5 mg via ORAL
  Filled 2015-08-30 (×2): qty 2

## 2015-08-30 NOTE — Progress Notes (Signed)
[facility] PROGRESS NOTE  AYOMIKUN STARLING HTD:[id number] DOB: 1951-05-27 DOA: 08/30/2015 PCP: Gerrit Heck, MD  Assessment/Plan: 1. Hypotension:  -due to Hypovolemia, responding to fluids -clinically no evidence of sepsis/SIRS -s/p 4L NS and then NS at 125 cc/hr -monitor urine output  2. Nausea/Vomiting -? Gastroenteritis -now resolved, CT abd benign -diet as tolerated  3. Chronic diarrhea -extensive workup in [facility], etiology unclear -supportive care -Loperamide and Lomotil PRN -Pepto-bismol PRN  4.  AKI:  -likely pre-renal injury from GI losses -FU Renal US -Continue IVF, hold ACE -place foley  3. HTN:  -Hold amlodipine, lisinopril, and metoprolol -Continue home statin and aspirin  4. Renal cell cancer:  -Continue home axitinib -Continue home dronabinol/ ondansetron  DVT PPx: Heparin  Code Status: Full Family Communication: Wife, present at the bedside  Dispo: SDU today, home in 1-2days  HPI/Subjective: Feels much better  Objective: Filed Vitals:   08/30/15 1015 08/30/15 1100  BP: 97/69 99/64  Pulse: 73 71  Temp:    Resp: 19 13    Intake/Output Summary (Last 24 hours) at 08/30/15 1141 Last data filed at 08/30/15 1100  Gross per 24 hour  Intake   1625 ml  Output      0 ml  Net   1625 ml   Filed Weights   08/30/15 0143  Weight: 74.844 kg (165 lb)    Exam:   General:  AAOx3  Cardiovascular: S1S@/RRR  Respiratory: CTAB  Abdomen: soft, NT, BS present  Musculoskeletal: no edema c/c   Data Reviewed: Basic Metabolic Panel:  Recent Labs Lab 08/30/15 0411  NA 136  K 4.5  CL 104  CO2 21*  GLUCOSE 87  BUN 68*  CREATININE 3.68*  CALCIUM 8.3*  MG 1.7   Liver Function Tests:  Recent Labs Lab 08/30/15 0411  AST 27  ALT 23  ALKPHOS 52  BILITOT 0.9  PROT 5.6*  ALBUMIN 3.3*    Recent Labs Lab 08/30/15 0411  LIPASE 27   No results for input(s): AMMONIA in the last 168 hours. CBC:  Recent Labs Lab  08/30/15 0411  WBC 7.0  NEUTROABS 5.0  HGB 14.2  HCT 41.9  MCV 90.7  PLT 175   Cardiac Enzymes: No results for input(s): CKTOTAL, CKMB, CKMBINDEX, TROPONINI in the last 168 hours. BNP (last 3 results) No results for input(s): BNP in the last 8760 hours.  ProBNP (last 3 results) No results for input(s): PROBNP in the last 8760 hours.  CBG: No results for input(s): GLUCAP in the last 168 hours.  Recent Results (from the past 240 hour(s))  C difficile quick scan w PCR reflex     Status: None   Collection Time: 08/30/15  5:02 AM  Result Value Ref Range Status   C Diff antigen NEGATIVE NEGATIVE Final   C Diff toxin NEGATIVE NEGATIVE Final   C Diff interpretation Negative for toxigenic C. difficile  Final  MRSA PCR Screening     Status: None   Collection Time: 08/30/15  8:17 AM  Result Value Ref Range Status   MRSA by PCR NEGATIVE NEGATIVE Final    Comment:        The GeneXpert MRSA Assay (FDA approved for NASAL specimens only), is one component of a comprehensive MRSA colonization surveillance program. It is not intended to diagnose MRSA infection nor to guide or monitor treatment for MRSA infections.      Studies: Ct Abdomen Pelvis Wo Contrast  08/30/2015  CLINICAL DATA:  Acute onset of nausea,  vomiting and diarrhea. Generalized weakness and abdominal soreness. Decreased renal function. Initial encounter. EXAM: CT ABDOMEN AND PELVIS WITHOUT CONTRAST TECHNIQUE: Multidetector CT imaging of the abdomen and pelvis was performed following the standard protocol without IV contrast. COMPARISON:  CT of the abdomen and pelvis from 01/09/2014 FINDINGS: Bronchiectasis is noted at the right lower lobe, with mild associated airspace opacity. This may reflect sequelae of chronic infection. Diffuse coronary artery calcifications are seen. Trace pericardial fluid remains within normal limits. The liver and spleen are unremarkable in appearance. Stones are seen within the gallbladder. The  gallbladder is otherwise unremarkable. The pancreas and adrenal glands are unremarkable. Scattered nonobstructing left renal stones measure up to 3 mm in size. The right kidney is absent. Mild left-sided perinephric stranding is noted, with a few small left renal cysts measuring up to 1.2 cm in size. There is no evidence of hydronephrosis. No obstructing ureteral stones are identified. No free fluid is identified. The small bowel is unremarkable in appearance. The stomach is within normal limits. No acute vascular abnormalities are seen. Scattered calcification is noted along the abdominal aorta and its branches. Anterior abdominal wall mesh is noted. The appendix is normal in caliber and contains air and fluid, without evidence of appendicitis. The colon is partially filled with fluid and air, and is unremarkable in appearance. The bladder is mildly distended and grossly unremarkable. The prostate is borderline enlarged, measuring 4.9 cm in transverse dimension. No inguinal lymphadenopathy is seen. No acute osseous abnormalities are identified. IMPRESSION: 1. No acute abnormality seen within the abdomen or pelvis. 2. Bronchiectasis at the right lower lobe, with mild associated airspace opacity. This may reflect sequelae of chronic infection. 3. Diffuse coronary artery calcifications seen. 4. Cholelithiasis.  Gallbladder otherwise unremarkable. 5. Scattered nonobstructing left renal stones measure up to 3 mm in size. Few small left renal cysts noted. 6. Scattered calcification along the abdominal aorta and its branches. 7. Borderline enlarged prostate. Electronically Signed   By: Garald Balding M.D.   On: 08/30/2015 05:53    Scheduled Meds: . aspirin EC  81 mg Oral Daily  . axitinib (INLYTA) 7 mg  7 mg Oral BID  . heparin subcutaneous  5,000 Units Subcutaneous 3 times per day  . levothyroxine  50 mcg Oral QAC breakfast  . mirtazapine  22.5 mg Oral Daily  . pravastatin  40 mg Oral Daily  . sodium chloride   3 mL Intravenous Q12H  . traZODone  100 mg Oral QHS   Continuous Infusions: . sodium chloride 125 mL/hr at 08/30/15 0524  . 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

## 2015-08-30 NOTE — ED Notes (Addendum)
Patient is from home and transported via [facility]. Pt is a renal cancer patient that is taking oral chemo medication. Pt reports he is experiencing generalized abd with diffuse tenderness. Also, is experiencing nausea, vomiting and diarrhea (this occurs all the time) for the last three days. IV was established en route and ZOFRAN 4 mg IV given.

## 2015-08-30 NOTE — ED Notes (Signed)
Pt has a urinal at bedside 

## 2015-08-30 NOTE — ED Notes (Signed)
Pt's I stat-CG4 is 1.37

## 2015-08-30 NOTE — ED Notes (Signed)
Attempted an IV x 2 but unsuccessful. Eliberto Ivory, RN is attempting IV line.

## 2015-08-30 NOTE — ED Notes (Signed)
Denies any vomiting while in the emergency department.

## 2015-08-30 NOTE — H&P (Signed)
History and Physical  Patient Name: [name]     ACZ:[id number]    DOB: May 26, 1951    DOA: 08/30/2015 Referring physician: Pryor Curia, MD PCP: Gerrit Heck, MD      Chief Complaint: Weakness and vomiting and diarrhea  HPI: [name] is a 64 y.o. male with a past medical history significant for metastatic renal cell cancer status post right nephrectomy, chronic diarrhea, HTN, and hypothyroidism who presents with 1 week progressive dehydration and weakness.  About one week ago, the patient developed nausea and decreased appetite, stopped eating and drinking as much, started vomiting a few days later NBNB emesis, then the last 2 or 3 days has had acutely worse diarrhea, and so much weakness that he couldn't walk anymore, and so his wife called 911.  In the ED, the patient was hypotensive to 87/66 on arrival, and had new AKI. Rapid C. difficile was negative, and a CT of the abdomen and pelvis without contrast showed no remarkable findings. 4 L of normal saline were administered, and [facility] were asked to admit for hypotension and AKI.  With regard to his chronic diarrhea, he has been extensively worked up by his oncologist at [facility]. They have tried Lomotil and loperamide without success. He finds some relief with Pepto-Bismol.      Review of Systems:  Pt complains of decreased appetite, nausea, abdominal discomfort from vomiting, recurrent emesis, worse diarrhea, weakness. Pt denies any fever, chills, hematochezia, hematemesis, cough, sputum, dysuria, hematuria.  All other systems negative except as just noted or noted in the history of present illness.  Allergies  Allergen Reactions  . Zolpidem Other (See Comments)    "horrible dreams"    Prior to Admission medications   Medication Sig Start Date End Date Taking? Authorizing Provider  amLODipine (NORVASC) 5 MG tablet Take 5 mg by mouth daily.   Yes Historical Provider, MD  aspirin EC 81 MG tablet Take 81 mg by mouth  daily.   Yes Historical Provider, MD  axitinib (INLYTA) 5 MG tablet Take 5 mg by mouth 2 (two) times daily.   Yes Historical Provider, MD  calcium citrate-vitamin D 500-400 MG-UNIT chewable tablet Chew 2 tablets by mouth 2 (two) times daily.   Yes Historical Provider, MD  diphenoxylate-atropine (LOMOTIL) 2.5-0.025 MG tablet Take 2 tablets by mouth 4 (four) times daily as needed. For diarrhea. 02/11/15  Yes Historical Provider, MD  dronabinol (MARINOL) 5 MG capsule Take 5 mg by mouth 2 (two) times daily as needed (for appetite).  07/22/15  Yes Historical Provider, MD  fenofibrate 160 MG tablet Take 160 mg by mouth daily. 07/30/15  Yes Historical Provider, MD  fish oil-omega-3 fatty acids 1000 MG capsule Take 1,200 mg by mouth daily.     Yes Historical Provider, MD  levothyroxine (SYNTHROID, LEVOTHROID) 50 MCG tablet Take 50 mcg by mouth daily. 08/08/15  Yes Historical Provider, MD  lisinopril (PRINIVIL,ZESTRIL) 10 MG tablet Take 10 mg by mouth daily. 08/02/15  Yes Historical Provider, MD  metoprolol succinate (TOPROL-XL) 25 MG 24 hr tablet Take 25 mg by mouth 2 (two) times daily. 08/08/15  Yes Historical Provider, MD  mirtazapine (REMERON) 15 MG tablet Take 22.5 mg by mouth daily. 08/07/15  Yes Historical Provider, MD  Multiple Vitamin (MULTIVITAMIN WITH MINERALS) TABS tablet Take 1 tablet by mouth daily.   Yes Historical Provider, MD  pravastatin (PRAVACHOL) 40 MG tablet Take 40 mg by mouth daily.   Yes Historical Provider, MD  traZODone (DESYREL) 50 MG  tablet Take 100 mg by mouth at bedtime. 08/07/15  Yes Historical Provider, MD  metoprolol tartrate (LOPRESSOR) 25 MG tablet Take 1 tablet (25 mg total) by mouth 2 (two) times daily. Patient not taking: Reported on 08/30/2015 10/10/11   Peter M Martinique, MD    Past Medical History  Diagnosis Date  . Coronary artery disease   . Hyperlipidemia   . Tobacco dependence   . Renal cell carcinoma     Has radiation for cancer and oral chemo     Past Surgical  History  Procedure Laterality Date  . Nephrectomy    . Neck surgery    . Cardiovascular stress test  03/22/2009    EF 61%, INFEROSEPTAL WALL DEFECT  . Excision of tumor in back    . Anal fissure repair    . Lung biopsy      Family history: family history includes Heart attack in his mother.  Social History: Patient lives with his wife.  He is from [place] originally. He used to drive a truck. He currently is able to walk around the house and up and down stairs without difficulty, but he can't leave the house because of 3 times a day diarrhea and fecal incontinence.  He is a smoker.       Physical Exam: BP 96/70 mmHg  Pulse 82  Temp(Src) 99 F (37.2 C) (Rectal)  Resp 14  Ht '5\' 10"'$  (1.778 m)  Wt 74.844 kg (165 lb)  BMI 23.68 kg/m2  SpO2 100% General appearance: Thin adult male, listless but in no acute distress.   Eyes: Anicteric, conjunctiva pink, lids and lashes normal.     ENT: No nasal deformity, discharge, or epistaxis.  OP moist without lesions.   Skin: Warm and dry.   Cardiac: RRR, nl S1-S2, no murmurs appreciated.  Capillary refill is now brisk.  Respiratory: Normal respiratory rate and rhythm.  CTAB without rales or wheezes. Abdomen: Abdomen soft without rigidity.  No TTP. No ascites, distension.   MSK: No deformities or effusions. Neuro: Sensorium intact and responding to questions, attention normal.  Speech is fluent.  Moves all extremities equally and with normal coordination.    Psych: Behavior appropriate.  Affect normal.  No evidence of aural or visual hallucinations or delusions.       Labs on Admission:  The metabolic panel shows serum creatinine 2.68 mg/dL from a baseline of 1.35 mg/dL and carry everywhere one month ago. Transaminases and bilirubin are normal. Albumin is slightly low at 3.3 g/dL. The magnesium is normal. The lipase is normal. A C. difficile is negative. A stool panel is pending. The complete blood count shows no leukocytosis, anemia,  thrombocytopenia.  Outside stool culture last urine is negative. Norovirus negative. Stool parasites are negative.  Stool osmolar gap appeared secretory (78).      Radiological Exams on Admission: Ct Abdomen Pelvis Wo Contrast 08/30/2015 IMPRESSION: 1. No acute abnormality seen within the abdomen or pelvis. 2. Bronchiectasis at the right lower lobe, with mild associated airspace opacity. This may reflect sequelae of chronic infection. 3. Diffuse coronary artery calcifications seen. 4. Cholelithiasis.  Gallbladder otherwise unremarkable. 5. Scattered nonobstructing left renal stones measure up to 3 mm in size. Few small left renal cysts noted. 6. Scattered calcification along the abdominal aorta and its branches. 7. Borderline enlarged prostate. Electronically Signed   By: Garald Balding M.D.   On: 08/30/2015 05:53        Assessment/Plan 1. Hypotension:  This is new.  Likely  hypovolemic, appears to be responding to fluids.  No evidence of sepsis and does not meet SIRS criteria, nor does the history suggest inflammatory diarrhea.  -Finish 4L NS and then NS at 125 cc/hr -Re-bolus if continues to have MAP < 65 mmHg -Culture if temperature > 38C    2. AKI:  This is new.  Presumed pre-renal injury given history of dehyradation/hypovolemia. -US renal to rule out hydronephrosis of single kidney -Urinalysis and urine electrolytes -Fluids as above  -Trend BMP  3. HTN:  -Hold amlodipine, lisinopril, and metoprolol -Continue home statin and aspirin  4. Renal cell cancer:  -Continue home axitinib -Continue home dronabinol and mirtazapine and ondansetron  5. Diarrhea:  Stable. Patient reports some success with bismuth -Loperamide and Lomotil PRN -Pepto-bismol PRN    DVT PPx: Heparin Diet: Regular Consultants: None Code Status: Full Family Communication: Wife, present at the bedside  Medical decision making: What exists of the patient's previous chart and [facility] was  reviewed in depth and the case was discussed with Dr. Leonides Schanz. Patient seen 6:44 AM on 08/30/2015.  Disposition Plan:  Admit to step down inpatient for hypotension and AKI.  Monitor renal function.  Anticipate 3-4 days hospitalization.      Edwin Dada [facility] Pager [phone]

## 2015-08-30 NOTE — ED Notes (Signed)
Bed: WA17 Expected date:  Expected time:  Means of arrival:  Comments: n,v

## 2015-08-30 NOTE — ED Notes (Signed)
Pt in CT.

## 2015-08-30 NOTE — ED Provider Notes (Signed)
By signing my name below, I, Stephania Fragmin, attest that this documentation has been prepared under the direction and in the presence of [place], DO. Electronically Signed: Stephania Fragmin, ED Scribe. 08/30/2015. 4:19 AM.  TIME SEEN: 2:45 AM   CHIEF COMPLAINT: Abdominal Pain; Emesis  HPI:  [name] is a 64 y.o. 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 Patient also complains of associated generalized weakness and sore abdominal pain that began today, secondary to his vomiting. He also notes diarrhea that has been ongoing for "forever," but acutely worsened recently. He notes he has had 6-7 BM's daily, increased from 3. Patient has tried Imodium with no relief, adding that it has "never" worked. Patient states he has been making "not much" urine. Patient states he has 3 different hypertension medications, which he has not taken in 3 days.  Patient has a history of hernia repair and right nephrectomy done 6 years ago. He denies any sick contact, recent hospitalization, or international travel. Patient lives in [place]. He denies fever or hematochezia.  Oncologist: Dr. Lossie Faes, [place], [place] PCP: Dr. Drema Dallas  ROS: See HPI Constitutional: no fever  Eyes: no drainage  ENT: no runny nose   Cardiovascular:  no chest pain  Resp: no SOB  GI: vomiting GU: no dysuria Integumentary: no rash  Allergy: no hives  Musculoskeletal: no leg swelling  Neurological: no slurred speech ROS otherwise negative  PAST MEDICAL HISTORY/PAST SURGICAL HISTORY:  Past Medical History  Diagnosis Date  . Coronary artery disease   . Hyperlipidemia   . Tobacco dependence   . Renal cell carcinoma     Has radiation for cancer and oral chemo     MEDICATIONS:  Prior  to Admission medications   Medication Sig Start Date End Date Taking? Authorizing Provider  amLODipine (NORVASC) 5 MG tablet Take 5 mg by mouth daily.    Historical Provider, MD  aspirin 81 MG tablet Take 81 mg by mouth daily.      Historical Provider, MD  docusate sodium (COLACE) 100 MG capsule Take 1 capsule (100 mg total) by mouth every 12 (twelve) hours. 01/09/14   Etta Quill, NP  everolimus (AFINITOR) 10 MG tablet Take 10 mg by mouth daily.      Historical Provider, MD  fenofibrate (TRICOR) 145 MG tablet Take 145 mg by mouth daily.      Historical Provider, MD  fish oil-omega-3 fatty acids 1000 MG capsule Take 1,200 mg by mouth daily.      Historical Provider, MD  metoprolol tartrate (LOPRESSOR) 25 MG tablet Take 1 tablet (25 mg total) by mouth 2 (two) times daily. 10/10/11   Peter M Martinique, MD  oxyCODONE-acetaminophen (PERCOCET/ROXICET) 5-325 MG per tablet Take 1-2 tablets by mouth every 6 (six) hours as needed for severe pain. 01/09/14   Etta Quill, NP  pravastatin (PRAVACHOL) 40 MG tablet Take 40 mg by mouth daily.    Historical Provider, MD    ALLERGIES:  No Known Allergies  SOCIAL HISTORY:  Social History  Substance Use Topics  . Smoking status: Current Every Day Smoker -- 1.00 packs/day    Types: Cigarettes  . Smokeless tobacco: Not on file  . Alcohol Use: No    FAMILY HISTORY: History reviewed. No pertinent family history.  EXAM:  BP 87/66 mmHg  Pulse 80  Temp(Src) 97.4 F (36.3 C) (Oral)  Resp 20  Ht '5\' 10"'$  (1.778 m)  Wt 165 lb (74.844 kg)  BMI 23.68 kg/m2  SpO2 100% CONSTITUTIONAL: Alert and oriented and responds appropriately to questions. Chronically ill-appearing, appears uncomfortable; hypotensive HEAD: Normocephalic EYES: Conjunctivae clear, PERRL ENT: normal nose; no rhinorrhea; dry mucous membranes; pharynx without lesions noted NECK: Supple, no meningismus, no LAD  CARD: RRR; S1 and S2 appreciated; no murmurs, no clicks, no rubs, no gallops RESP: Normal  chest excursion without splinting or tachypnea; breath sounds clear and equal bilaterally; no wheezes, no rhonchi, no rales, no hypoxia or respiratory distress, speaking full sentences ABD/GI: Hyperactive bowel sounds; non-distended; soft, diffusely tender to palpaton, no rebound, no guarding, no peritoneal signs BACK:  The back appears normal and is non-tender to palpation, there is no CVA tenderness EXT: Normal ROM in all joints; non-tender to palpation; no edema; normal capillary refill; no cyanosis, no calf tenderness or swelling    SKIN: Normal color for age and race; warm NEURO: Moves all extremities equally, sensation to light touch intact diffusely, cranial nerves II through XII intact PSYCH: The patient's mood and manner are appropriate. Grooming and personal hygiene are appropriate.  MEDICAL DECISION MAKING: Patient here with hypotension likely from dehydration from vomiting and diarrhea. He does have mild diffuse tenderness throughout his abdomen. We'll proceed to CT of his abdomen and pelvis for evaluation of intra-abdominal pathology such as abscess, colitis, bowel obstruction, cholecystitis, pancreatitis. We'll give IV fluids given he is hypertensive. Rectal temperature is 90.9. Patient will need admission.  ED PROGRESS: Patient's labs show creatinine of 3.68 which is doubled from his previous in 2015. Suspect this may be prerenal due to dehydration. No leukocytosis. Stool cultures have been sent. CT scan shows no acute abnormality. He has bronchiectasis at the right lower lobe likely sequela of chronic infection. He has cholelithiasis with otherwise normal gallbladder. Negative Murphy sign on exam. His scattered nonobstructing left renal stones and a small left renal cyst. Status post right-sided nephrectomy. Patient's blood pressure is slowly improving with IV hydration. He has received 3 L of IV fluids in the emergency department. We'll give a fourth liter. Lactate is normal. Patient will  need admission.   6:20 AM  D/w Dr. Loleta Books for admission to stepdown. C. difficile negative. Stool cultures pending. Urinalysis pending.    CRITICAL CARE Performed by: Nyra Jabs   Total critical care time: 60 minutes  Critical care time was exclusive of separately billable procedures and treating other patients.  Critical care was necessary to treat or prevent imminent or life-threatening deterioration.  Critical care was time spent personally by me on the following activities: development of treatment plan with patient and/or surrogate as well as nursing, discussions with consultants, evaluation of patient's response to treatment, examination of patient, obtaining history from patient or surrogate, ordering and performing treatments and interventions, ordering and review of laboratory studies, ordering and review of radiographic studies, pulse oximetry and re-evaluation of patient's condition.     I personally performed the services described in this documentation, which was scribed in my presence. The recorded information has been reviewed and is accurate.       [place], DO 08/30/15 [phone]

## 2015-08-31 DIAGNOSIS — I10 Essential (primary) hypertension: Secondary | ICD-10-CM

## 2015-08-31 DIAGNOSIS — N182 Chronic kidney disease, stage 2 (mild): Secondary | ICD-10-CM

## 2015-08-31 LAB — CBC
HCT: 38.6 % — ABNORMAL LOW (ref 39.0–52.0)
HEMOGLOBIN: 12.9 g/dL — AB (ref 13.0–17.0)
MCH: 30.7 pg (ref 26.0–34.0)
MCHC: 33.4 g/dL (ref 30.0–36.0)
MCV: 91.9 fL (ref 78.0–100.0)
PLATELETS: 134 10*3/uL — AB (ref 150–400)
RBC: 4.2 MIL/uL — ABNORMAL LOW (ref 4.22–5.81)
RDW: 14.4 % (ref 11.5–15.5)
WBC: 3.7 10*3/uL — ABNORMAL LOW (ref 4.0–10.5)

## 2015-08-31 LAB — COMPREHENSIVE METABOLIC PANEL
ALBUMIN: 3.2 g/dL — AB (ref 3.5–5.0)
ALK PHOS: 59 U/L (ref 38–126)
ALT: 32 U/L (ref 17–63)
ANION GAP: 12 (ref 5–15)
AST: 48 U/L — ABNORMAL HIGH (ref 15–41)
BILIRUBIN TOTAL: 1 mg/dL (ref 0.3–1.2)
BUN: 45 mg/dL — AB (ref 6–20)
CALCIUM: 7 mg/dL — AB (ref 8.9–10.3)
CO2: 18 mmol/L — ABNORMAL LOW (ref 22–32)
CREATININE: 2.17 mg/dL — AB (ref 0.61–1.24)
Chloride: 110 mmol/L (ref 101–111)
GFR calc Af Amer: 36 mL/min — ABNORMAL LOW (ref >60)
GFR calc non Af Amer: 31 mL/min — ABNORMAL LOW (ref >60)
GLUCOSE: 87 mg/dL (ref 65–99)
Potassium: 3.6 mmol/L (ref 3.5–5.1)
Sodium: 140 mmol/L (ref 135–145)
TOTAL PROTEIN: 5.2 g/dL — AB (ref 6.5–8.1)

## 2015-08-31 LAB — GASTROINTESTINAL PANEL BY PCR, STOOL (REPLACES STOOL CULTURE)
Adenovirus F40/41: NOT DETECTED
Astrovirus: NOT DETECTED
CYCLOSPORA CAYETANENSIS: NOT DETECTED
Campylobacter species: NOT DETECTED
Cryptosporidium: NOT DETECTED
E. COLI O157: NOT DETECTED
ENTAMOEBA HISTOLYTICA: NOT DETECTED
Enteroaggregative E coli (EAEC): NOT DETECTED
Enteropathogenic E coli (EPEC): NOT DETECTED
Enterotoxigenic E coli (ETEC): NOT DETECTED
Giardia lamblia: NOT DETECTED
NOROVIRUS GI/GII: NOT DETECTED
Plesimonas shigelloides: NOT DETECTED
Rotavirus A: NOT DETECTED
SALMONELLA SPECIES: NOT DETECTED
SAPOVIRUS (I, II, IV, AND V): NOT DETECTED
SHIGELLA/ENTEROINVASIVE E COLI (EIEC): NOT DETECTED
Shiga like toxin producing E coli (STEC): NOT DETECTED
VIBRIO SPECIES: NOT DETECTED
Vibrio cholerae: NOT DETECTED
Yersinia enterocolitica: NOT DETECTED

## 2015-08-31 MED ORDER — AMLODIPINE BESYLATE 10 MG PO TABS
10.0000 mg | ORAL_TABLET | Freq: Every day | ORAL | Status: DC
  Administered 2015-08-31: 10 mg via ORAL
  Filled 2015-08-31: qty 1

## 2015-08-31 MED ORDER — HYDRALAZINE HCL 20 MG/ML IJ SOLN
10.0000 mg | Freq: Once | INTRAMUSCULAR | Status: AC
  Administered 2015-08-31: 10 mg via INTRAVENOUS
  Filled 2015-08-31: qty 1

## 2015-08-31 MED ORDER — DIPHENOXYLATE-ATROPINE 2.5-0.025 MG PO TABS
1.0000 | ORAL_TABLET | Freq: Four times a day (QID) | ORAL | Status: DC
Start: 2015-08-31 — End: 2015-09-01
  Administered 2015-08-31 (×3): 1 via ORAL
  Filled 2015-08-31 (×3): qty 1

## 2015-08-31 MED ORDER — LOPERAMIDE HCL 2 MG PO CAPS
2.0000 mg | ORAL_CAPSULE | Freq: Two times a day (BID) | ORAL | Status: DC
  Administered 2015-08-31 – 2015-09-01 (×2): 2 mg via ORAL
  Filled 2015-08-31 (×2): qty 1

## 2015-08-31 MED ORDER — ENSURE ENLIVE PO LIQD
237.0000 mL | Freq: Two times a day (BID) | ORAL | Status: DC
Start: 2015-08-31 — End: 2015-09-01
  Administered 2015-08-31: 237 mL via ORAL

## 2015-08-31 MED ORDER — METOPROLOL SUCCINATE ER 25 MG PO TB24
25.0000 mg | ORAL_TABLET | Freq: Two times a day (BID) | ORAL | Status: DC
  Administered 2015-08-31 (×2): 25 mg via ORAL
  Filled 2015-08-31 (×2): qty 1

## 2015-08-31 NOTE — Progress Notes (Signed)
Initial Nutrition Assessment  DOCUMENTATION CODES:   Not applicable  INTERVENTION:  -Ensure Enlive po BID, each supplement provides 350 kcal and 20 grams of protein  NUTRITION DIAGNOSIS:   Inadequate oral intake related to vomiting, nausea as evidenced by per patient/family report.  GOAL:   Patient will meet greater than or equal to 90% of their needs  MONITOR:   PO intake, Supplement acceptance, I & O's, Labs, Diet advancement  REASON FOR ASSESSMENT:   Malnutrition Screening Tool    ASSESSMENT:   [name] is a 64 y.o. male with a past medical history significant for metastatic renal cell cancer status post right nephrectomy, chronic diarrhea, HTN, and hypothyroidism who presents with 1 week progressive dehydration and weakness. About one week ago, the patient developed nausea and decreased appetite, stopped eating and drinking as much, started vomiting a few days later NBNB emesis, then the last 2 or 3 days has had acutely worse diarrhea, and so much weakness that he couldn't walk anymore, and so his wife called 911. In the ED, the patient was hypotensive to 87/66 on arrival, and had new AKI. Rapid C. difficile was negative, and a CT of the abdomen and pelvis without contrast showed no remarkable findings. 4 L of normal saline were administered, and [facility] were asked to admit for hypotension and AKI  Spoke with pt, family at beside. Pt reported constant nausea/vomiting for approximately 1 wk, but has since resolved during stay. He also reports some instances of diarrhea, taste changes secondary to cancer/chemotherapy.   Encouraged pt to try to suck on lemons to help resolve taste changes. Reports no wt loss, decent appetite, cycles on and off of boost at home, but cost has been an issue in addition to chemotherapy.  Provided pt with coupons to help.  Nutrition-Focused physical exam completed. Findings are no fat depletion, no muscle depletion, and no edema.   Labs and  Medications reviewed   Diet Order:  Diet regular Room service appropriate?: Yes; Fluid consistency:: Thin  Skin:  Reviewed, no issues  Last BM:  12/27  Height:   Ht Readings from Last 1 Encounters:  08/30/15 '5\' 10"'$  (1.778 m)    Weight:   Wt Readings from Last 1 Encounters:  08/30/15 165 lb (74.844 kg)    Ideal Body Weight:  75.45 kg  BMI:  Body mass index is 23.68 kg/(m^2).  Estimated Nutritional Needs:   Kcal:  2250-2600 calories  Protein:  90-115 grams  Fluid:  >/= 2.25L  EDUCATION NEEDS:   No education needs identified at this time  Satira Anis. Cloud Graham, MS, RD LDN After Hours/Weekend Pager [phone]

## 2015-08-31 NOTE — Care Management Note (Signed)
Case Management Note  Patient Details  Name: [name] MRN: [id number] Date of Birth: September 21, 1950  Subjective/Objective:         aki and sepsis           Action/Plan:Date: August 31, 2015 Chart reviewed for concurrent status and case management needs. Will continue to follow patient for changes and needs: Velva Harman, RN, BSN, [place]   [phone]   Expected Discharge Date:   (UNKNOWN)               Expected Discharge Plan:  Home/Self Care  In-House Referral:  NA  Discharge planning Services  CM Consult  Post Acute Care Choice:  NA Choice offered to:  NA  DME Arranged:    DME Agency:     HH Arranged:    HH Agency:     Status of Service:  In process, will continue to follow  Medicare Important Message Given:    Date Medicare IM Given:    Medicare IM give by:    Date Additional Medicare IM Given:    Additional Medicare Important Message give by:     If discussed at [place] of Stay Meetings, dates discussed:    Additional Comments:  Leeroy Cha, RN 08/31/2015, 9:51 AM

## 2015-08-31 NOTE — Progress Notes (Addendum)
[facility] PROGRESS NOTE  [name] UXN:[id number] DOB: 1950-12-03 DOA: 08/30/2015 PCP: Gerrit Heck, MD  Assessment/Plan: 1. Hypotension:  -due to Hypovolemia, responding to fluids -clinically no evidence of sepsis/SIRS -s/p 4L NS and then NS at 125 cc/hr, cut down IVF, Bp stable now  2. Nausea/Vomiting -? Gastroenteritis -now resolved, CT abd benign -diet as tolerated  3. Chronic diarrhea -extensive workup in [facility], etiology felt to be axitinib -Loperamide and Lomotil change to scheduled, PRN Pepto-bismol -FU with Onc to decide further dose reduction in axitinib  4.  AKI:  -likely pre-renal injury from GI losses -improving, Renal US with hydronephrosis -Continue IVF, hold ACE  3. HTN:  -resume amlodipine, Toprol, hold lisinopril -Continue home statin and aspirin  4. Renal cell cancer:  -Continue home axitinib -Continue home dronabinol/ ondansetron  DVT PPx: Heparin  Code Status: Full Family Communication: Wife, present at the bedside  Dispo: tx to floor, home in 1-2days  HPI/Subjective: Feels much better  Objective: Filed Vitals:   08/31/15 0400 08/31/15 0600  BP: 161/117 129/81  Pulse: 75 70  Temp: 98.5 F (36.9 C)   Resp: 15 12    Intake/Output Summary (Last 24 hours) at 08/31/15 2542 Last data filed at 08/31/15 0400  Gross per 24 hour  Intake   1975 ml  Output   1675 ml  Net    300 ml   Filed Weights   08/30/15 0143  Weight: 74.844 kg (165 lb)    Exam:   General:  AAOx3  Cardiovascular: S1S@/RRR  Respiratory: CTAB  Abdomen: soft, NT, BS present  Musculoskeletal: no edema c/c   Data Reviewed: Basic Metabolic Panel:  Recent Labs Lab 08/30/15 0411 08/31/15 0354  NA 136 140  K 4.5 3.6  CL 104 110  CO2 21* 18*  GLUCOSE 87 87  BUN 68* 45*  CREATININE 3.68* 2.17*  CALCIUM 8.3* 7.0*  MG 1.7  --    Liver Function Tests:  Recent Labs Lab 08/30/15 0411 08/31/15 0354  AST 27 48*  ALT 23 32   ALKPHOS 52 59  BILITOT 0.9 1.0  PROT 5.6* 5.2*  ALBUMIN 3.3* 3.2*    Recent Labs Lab 08/30/15 0411  LIPASE 27   No results for input(s): AMMONIA in the last 168 hours. CBC:  Recent Labs Lab 08/30/15 0411 08/31/15 0354  WBC 7.0 3.7*  NEUTROABS 5.0  --   HGB 14.2 12.9*  HCT 41.9 38.6*  MCV 90.7 91.9  PLT 175 134*   Cardiac Enzymes: No results for input(s): CKTOTAL, CKMB, CKMBINDEX, TROPONINI in the last 168 hours. BNP (last 3 results) No results for input(s): BNP in the last 8760 hours.  ProBNP (last 3 results) No results for input(s): PROBNP in the last 8760 hours.  CBG: No results for input(s): GLUCAP in the last 168 hours.  Recent Results (from the past 240 hour(s))  Blood culture (routine x 2)     Status: None (Preliminary result)   Collection Time: 08/30/15  3:47 AM  Result Value Ref Range Status   Specimen Description BLOOD RIGHT FA  Final   Special Requests BOTTLES DRAWN AEROBIC AND ANAEROBIC 5CC  Final   Culture   Final    NO GROWTH 1 DAY Performed at [facility]    Report Status PENDING  Incomplete  Blood culture (routine x 2)     Status: None (Preliminary result)   Collection Time: 08/30/15  4:38 AM  Result Value Ref Range Status   Specimen Description BLOOD  LEFT HAND  Final   Special Requests IN PEDIATRIC BOTTLE [place]  Final   Culture   Final    NO GROWTH < 24 HOURS Performed at [facility]    Report Status PENDING  Incomplete  C difficile quick scan w PCR reflex     Status: None   Collection Time: 08/30/15  5:02 AM  Result Value Ref Range Status   C Diff antigen NEGATIVE NEGATIVE Final   C Diff toxin NEGATIVE NEGATIVE Final   C Diff interpretation Negative for toxigenic C. difficile  Final  MRSA PCR Screening     Status: None   Collection Time: 08/30/15  8:17 AM  Result Value Ref Range Status   MRSA by PCR NEGATIVE NEGATIVE Final    Comment:        The GeneXpert MRSA Assay (FDA approved for NASAL specimens only), is one  component of a comprehensive MRSA colonization surveillance program. It is not intended to diagnose MRSA infection nor to guide or monitor treatment for MRSA infections.      Studies: Ct Abdomen Pelvis Wo Contrast  08/30/2015  CLINICAL DATA:  Acute onset of nausea, vomiting and diarrhea. Generalized weakness and abdominal soreness. Decreased renal function. Initial encounter. EXAM: CT ABDOMEN AND PELVIS WITHOUT CONTRAST TECHNIQUE: Multidetector CT imaging of the abdomen and pelvis was performed following the standard protocol without IV contrast. COMPARISON:  CT of the abdomen and pelvis from 01/09/2014 FINDINGS: Bronchiectasis is noted at the right lower lobe, with mild associated airspace opacity. This may reflect sequelae of chronic infection. Diffuse coronary artery calcifications are seen. Trace pericardial fluid remains within normal limits. The liver and spleen are unremarkable in appearance. Stones are seen within the gallbladder. The gallbladder is otherwise unremarkable. The pancreas and adrenal glands are unremarkable. Scattered nonobstructing left renal stones measure up to 3 mm in size. The right kidney is absent. Mild left-sided perinephric stranding is noted, with a few small left renal cysts measuring up to 1.2 cm in size. There is no evidence of hydronephrosis. No obstructing ureteral stones are identified. No free fluid is identified. The small bowel is unremarkable in appearance. The stomach is within normal limits. No acute vascular abnormalities are seen. Scattered calcification is noted along the abdominal aorta and its branches. Anterior abdominal wall mesh is noted. The appendix is normal in caliber and contains air and fluid, without evidence of appendicitis. The colon is partially filled with fluid and air, and is unremarkable in appearance. The bladder is mildly distended and grossly unremarkable. The prostate is borderline enlarged, measuring 4.9 cm in transverse dimension.  No inguinal lymphadenopathy is seen. No acute osseous abnormalities are identified. IMPRESSION: 1. No acute abnormality seen within the abdomen or pelvis. 2. Bronchiectasis at the right lower lobe, with mild associated airspace opacity. This may reflect sequelae of chronic infection. 3. Diffuse coronary artery calcifications seen. 4. Cholelithiasis.  Gallbladder otherwise unremarkable. 5. Scattered nonobstructing left renal stones measure up to 3 mm in size. Few small left renal cysts noted. 6. Scattered calcification along the abdominal aorta and its branches. 7. Borderline enlarged prostate. Electronically Signed   By: Garald Balding M.D.   On: 08/30/2015 05:53   US Renal  08/30/2015  CLINICAL DATA:  Acute renal insufficiency, dehydration, status post right nephrectomy for renal cell carcinoma EXAM: RENAL / URINARY TRACT ULTRASOUND COMPLETE COMPARISON:  08/30/2015 FINDINGS: Right Kidney: Surgical absent Left Kidney: Length: 13.7 cm in length. No hydronephrosis. There is a cyst in midpole measures 1.1  x 1 cm. Tiny nonobstructive calcified calculus measures about 3 mm Bladder: Appears normal for degree of bladder distention. IMPRESSION: 1. Surgically absent right kidney. No left hydronephrosis. Left nonobstructive nephrolithiasis. A left renal cyst measures 1 cm. Electronically Signed   By: Lahoma Crocker M.D.   On: 08/30/2015 12:11    Scheduled Meds: . aspirin EC  81 mg Oral Daily  . axitinib (INLYTA) 7 mg  7 mg Oral BID  . diphenoxylate-atropine  1 tablet Oral QID  . heparin subcutaneous  5,000 Units Subcutaneous 3 times per day  . levothyroxine  50 mcg Oral QAC breakfast  . loperamide  2 mg Oral BID  . mirtazapine  22.5 mg Oral Daily  . pravastatin  40 mg Oral Daily  . sodium chloride  3 mL Intravenous Q12H  . traZODone  100 mg Oral QHS   Continuous Infusions: . 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

## 2015-09-01 DIAGNOSIS — N170 Acute kidney failure with tubular necrosis: Secondary | ICD-10-CM

## 2015-09-01 DIAGNOSIS — R197 Diarrhea, unspecified: Secondary | ICD-10-CM | POA: Insufficient documentation

## 2015-09-01 LAB — BASIC METABOLIC PANEL
Anion gap: 7 (ref 5–15)
BUN: 25 mg/dL — ABNORMAL HIGH (ref 6–20)
CHLORIDE: 114 mmol/L — AB (ref 101–111)
CO2: 23 mmol/L (ref 22–32)
CREATININE: 1.21 mg/dL (ref 0.61–1.24)
Calcium: 6.5 mg/dL — ABNORMAL LOW (ref 8.9–10.3)
GFR calc non Af Amer: >60 mL/min (ref >60)
Glucose, Bld: 108 mg/dL — ABNORMAL HIGH (ref 65–99)
Potassium: 3.2 mmol/L — ABNORMAL LOW (ref 3.5–5.1)
Sodium: 144 mmol/L (ref 135–145)

## 2015-09-01 LAB — CBC
HEMATOCRIT: 35.3 % — AB (ref 39.0–52.0)
HEMOGLOBIN: 11.8 g/dL — AB (ref 13.0–17.0)
MCH: 30.6 pg (ref 26.0–34.0)
MCHC: 33.4 g/dL (ref 30.0–36.0)
MCV: 91.5 fL (ref 78.0–100.0)
Platelets: 125 10*3/uL — ABNORMAL LOW (ref 150–400)
RBC: 3.86 MIL/uL — ABNORMAL LOW (ref 4.22–5.81)
RDW: 14.2 % (ref 11.5–15.5)
WBC: 3 10*3/uL — ABNORMAL LOW (ref 4.0–10.5)

## 2015-09-01 MED ORDER — LOPERAMIDE HCL 2 MG PO CAPS: 2.0000 mg | ORAL_CAPSULE | Freq: Two times a day (BID) | ORAL | Status: AC

## 2015-09-01 MED ORDER — LOPERAMIDE HCL 2 MG PO CAPS: 2.0000 mg | ORAL_CAPSULE | Freq: Two times a day (BID) | ORAL | Status: DC

## 2015-09-01 MED ORDER — DIPHENOXYLATE-ATROPINE 2.5-0.025 MG PO TABS: 1.0000 | ORAL_TABLET | Freq: Four times a day (QID) | ORAL | Status: AC

## 2015-09-01 MED ORDER — DIPHENOXYLATE-ATROPINE 2.5-0.025 MG PO TABS: 1.0000 | ORAL_TABLET | Freq: Four times a day (QID) | ORAL | Status: DC

## 2015-09-01 MED ORDER — DIPHENOXYLATE-ATROPINE 2.5-0.025 MG PO TABS: 2.0000 | ORAL_TABLET | Freq: Four times a day (QID) | ORAL | Status: DC

## 2015-09-01 NOTE — Progress Notes (Signed)
Pt d/c to home as per MD order. Prescriptions (lomotil), reasons to return to Ed/MD, follow up appts, and after visit care reviewed with pt and wife at bedside. Pt offered no questions. PIV X2 removed without complication. Prescriptions retrieved from pharmacy and appropriate paperwork completed and left in chart. Pt and RN signed discharge summary. Pt escorted off of unut via wheelchair by PCT.

## 2015-09-01 NOTE — Discharge Summary (Signed)
Physician Discharge Summary  [name]:[id number] DOB: 12-25-1950 DOA: 08/30/2015  PCP: Gerrit Heck, MD  Admit date: 08/30/2015 Discharge date: 09/01/2015  Time spent: 45 minutes  Recommendations for Outpatient Follow-up:  1. Dr.Milowsky on 09/13/15 2. PCP Dr.Barnes in 1week   Discharge Diagnoses:  Principal Problem:   Hypotension   Chronic Diarrhea   Metastatic renal cell carcinoma to lung (HCC)   AKI (acute kidney injury) ([place])   Essential hypertension   CKD (chronic kidney disease) stage 2, GFR 60-89 ml/min   Acute renal failure ([place])   Diarrhea   Discharge Condition: improved  Diet recommendation: heart healthy  Filed Weights   08/30/15 0143  Weight: 74.844 kg (165 lb)    History of present illness:  Chief Complaint: Weakness and vomiting and diarrhea HPI: [name] is a 64 y.o. male with a past medical history significant for metastatic renal cell cancer status post right nephrectomy, chronic diarrhea, HTN, and hypothyroidism who presented with 1 week progressive dehydration and weakness. About one week ago, the patient developed nausea and decreased appetite, stopped eating and drinking as much, started vomiting a few days later NBNB emesis, then the last 2 or 3 days has had acutely worse diarrhea, and so much weakness that he couldn't walk anymore, and so his wife called 911. In the ED, the patient was hypotensive to 87/66 on arrival, and had new AKI. Rapid C. difficile was negative, and a CT of the abdomen and pelvis without contrast showed no remarkable findings. 4 L of normal saline were administered  Hospital Course:  1. Hypotension:  -due to Hypovolemia, responded to fluids -clinically no evidence of sepsis/SIRS -s/p 4L NS and then was on NS at 125 cc/hr,  -since BP stabilized, weaned off IVF  2. Nausea/Vomiting -? Gastroenteritis vs due to oral chemo -now resolved, CT abd benign -diet as tolerated  3. Chronic  diarrhea -extensive workup in [facility], etiology felt to be axitinib, this dose was cut down to '7mg'$  BID from '10mg'$  due to diarrhea in the past -Loperamide and Lomotil changed to scheduled, PRN Pepto-bismol -FU with Onc to decide further dose reduction in axitinib, or alternate therapy -diarrhea better with scheduled lomotil and imodium  4. AKI:  -likely pre-renal injury from GI losses -improved, Renal US with hydronephrosis -lisinopril stopped  3. HTN:  -resume amlodipine, Toprol, stopped lisinopril -Continue home statin and aspirin  4. Renal cell cancer:  -Continue home axitinib, FU with dr.Milowsky at [facility] home dronabinol/ ondansetron   Discharge Exam: Filed Vitals:   08/31/15 2113 09/01/15 0619  BP: 137/83 116/77  Pulse: 65 56  Temp: 97.6 F (36.4 C) 97.5 F (36.4 C)  Resp: 16 16    General: AAOx3 Cardiovascular: S1S2/RRR Respiratory:CTAB  Discharge Instructions   Discharge Instructions    Diet - low sodium heart healthy    Complete by:  As directed      Increase activity slowly    Complete by:  As directed           Current Discharge Medication List    START taking these medications   Details  loperamide (IMODIUM) 2 MG capsule Take 1 capsule (2 mg total) by mouth 2 (two) times daily. Refills: 0      CONTINUE these medications which have CHANGED   Details  diphenoxylate-atropine (LOMOTIL) 2.5-0.025 MG tablet Take 2 tablets by mouth 4 (four) times daily. For diarrhea. Refills: 0      CONTINUE these medications which have NOT CHANGED  Details  amLODipine (NORVASC) 5 MG tablet Take 5 mg by mouth daily.    aspirin EC 81 MG tablet Take 81 mg by mouth daily.    !! axitinib (INLYTA) 1 MG tablet Take 2 mg by mouth 2 (two) times daily. Take with 5 mg for total dose of 7 mg    !! axitinib (INLYTA) 5 MG tablet Take 5 mg by mouth 2 (two) times daily. Take with 2 x 1 mg tabs for total dose of 7 mg    calcium citrate-vitamin D 500-400 MG-UNIT  chewable tablet Chew 2 tablets by mouth 2 (two) times daily.    dronabinol (MARINOL) 5 MG capsule Take 5 mg by mouth 2 (two) times daily as needed (for appetite).  Refills: 5    fenofibrate 160 MG tablet Take 160 mg by mouth daily. Refills: 5    fish oil-omega-3 fatty acids 1000 MG capsule Take 1,200 mg by mouth daily.      levothyroxine (SYNTHROID, LEVOTHROID) 50 MCG tablet Take 50 mcg by mouth daily. Refills: 11    metoprolol succinate (TOPROL-XL) 25 MG 24 hr tablet Take 25 mg by mouth 2 (two) times daily. Refills: 5    mirtazapine (REMERON) 15 MG tablet Take 22.5 mg by mouth daily. Refills: 5    Multiple Vitamin (MULTIVITAMIN WITH MINERALS) TABS tablet Take 1 tablet by mouth daily.    pravastatin (PRAVACHOL) 40 MG tablet Take 40 mg by mouth daily.    traZODone (DESYREL) 50 MG tablet Take 100 mg by mouth at bedtime. Refills: 3     !! - Potential duplicate medications found. Please discuss with provider.    STOP taking these medications     lisinopril (PRINIVIL,ZESTRIL) 10 MG tablet      metoprolol tartrate (LOPRESSOR) 25 MG tablet        Allergies  Allergen Reactions  . Zolpidem Other (See Comments)    "horrible dreams"   Follow-up Information    Follow up with Gerrit Heck, MD. Schedule an appointment as soon as possible for a visit in 1 week.   Specialty:  Family Medicine   Contact information:   [place] [address] [phone]       Follow up with Lossie Faes I, MD On 09/13/2015.   Specialty:  Internal Medicine   Contact information:   [address] [facility] [id number] Phys[facility] Office [place] [id number] [phone]        The results of significant diagnostics from this hospitalization (including imaging, microbiology, ancillary and laboratory) are listed below for reference.    Significant Diagnostic Studies: Ct Abdomen Pelvis Wo Contrast  08/30/2015  CLINICAL DATA:  Acute onset of nausea,  vomiting and diarrhea. Generalized weakness and abdominal soreness. Decreased renal function. Initial encounter. EXAM: CT ABDOMEN AND PELVIS WITHOUT CONTRAST TECHNIQUE: Multidetector CT imaging of the abdomen and pelvis was performed following the standard protocol without IV contrast. COMPARISON:  CT of the abdomen and pelvis from 01/09/2014 FINDINGS: Bronchiectasis is noted at the right lower lobe, with mild associated airspace opacity. This may reflect sequelae of chronic infection. Diffuse coronary artery calcifications are seen. Trace pericardial fluid remains within normal limits. The liver and spleen are unremarkable in appearance. Stones are seen within the gallbladder. The gallbladder is otherwise unremarkable. The pancreas and adrenal glands are unremarkable. Scattered nonobstructing left renal stones measure up to 3 mm in size. The right kidney is absent. Mild left-sided perinephric stranding is noted, with a few small left renal cysts  measuring up to 1.2 cm in size. There is no evidence of hydronephrosis. No obstructing ureteral stones are identified. No free fluid is identified. The small bowel is unremarkable in appearance. The stomach is within normal limits. No acute vascular abnormalities are seen. Scattered calcification is noted along the abdominal aorta and its branches. Anterior abdominal wall mesh is noted. The appendix is normal in caliber and contains air and fluid, without evidence of appendicitis. The colon is partially filled with fluid and air, and is unremarkable in appearance. The bladder is mildly distended and grossly unremarkable. The prostate is borderline enlarged, measuring 4.9 cm in transverse dimension. No inguinal lymphadenopathy is seen. No acute osseous abnormalities are identified. IMPRESSION: 1. No acute abnormality seen within the abdomen or pelvis. 2. Bronchiectasis at the right lower lobe, with mild associated airspace opacity. This may reflect sequelae of chronic  infection. 3. Diffuse coronary artery calcifications seen. 4. Cholelithiasis.  Gallbladder otherwise unremarkable. 5. Scattered nonobstructing left renal stones measure up to 3 mm in size. Few small left renal cysts noted. 6. Scattered calcification along the abdominal aorta and its branches. 7. Borderline enlarged prostate. Electronically Signed   By: Garald Balding M.D.   On: 08/30/2015 05:53   US Renal  08/30/2015  CLINICAL DATA:  Acute renal insufficiency, dehydration, status post right nephrectomy for renal cell carcinoma EXAM: RENAL / URINARY TRACT ULTRASOUND COMPLETE COMPARISON:  08/30/2015 FINDINGS: Right Kidney: Surgical absent Left Kidney: Length: 13.7 cm in length. No hydronephrosis. There is a cyst in midpole measures 1.1 x 1 cm. Tiny nonobstructive calcified calculus measures about 3 mm Bladder: Appears normal for degree of bladder distention. IMPRESSION: 1. Surgically absent right kidney. No left hydronephrosis. Left nonobstructive nephrolithiasis. A left renal cyst measures 1 cm. Electronically Signed   By: Lahoma Crocker M.D.   On: 08/30/2015 12:11    Microbiology: Recent Results (from the past 240 hour(s))  Blood culture (routine x 2)     Status: None (Preliminary result)   Collection Time: 08/30/15  3:47 AM  Result Value Ref Range Status   Specimen Description BLOOD RIGHT FA  Final   Special Requests BOTTLES DRAWN AEROBIC AND ANAEROBIC 5CC  Final   Culture   Final    NO GROWTH 1 DAY Performed at [facility]    Report Status PENDING  Incomplete  Blood culture (routine x 2)     Status: None (Preliminary result)   Collection Time: 08/30/15  4:38 AM  Result Value Ref Range Status   Specimen Description BLOOD LEFT HAND  Final   Special Requests IN PEDIATRIC BOTTLE [place]  Final   Culture   Final    NO GROWTH < 24 HOURS Performed at [facility]    Report Status PENDING  Incomplete  Gastrointestinal Panel by PCR , Stool     Status: None   Collection Time: 08/30/15   5:02 AM  Result Value Ref Range Status   Campylobacter species NOT DETECTED NOT DETECTED Final   Plesimonas shigelloides NOT DETECTED NOT DETECTED Final   Salmonella species NOT DETECTED NOT DETECTED Final   Yersinia enterocolitica NOT DETECTED NOT DETECTED Final   Vibrio species NOT DETECTED NOT DETECTED Final   Vibrio cholerae NOT DETECTED NOT DETECTED Final   Enteroaggregative E coli (EAEC) NOT DETECTED NOT DETECTED Final   Enteropathogenic E coli (EPEC) NOT DETECTED NOT DETECTED Final   Enterotoxigenic E coli (ETEC) NOT DETECTED NOT DETECTED Final   Shiga like toxin producing E coli (STEC) NOT  DETECTED NOT DETECTED Final   E. coli O157 NOT DETECTED NOT DETECTED Final   Shigella/Enteroinvasive E coli (EIEC) NOT DETECTED NOT DETECTED Final   Cryptosporidium NOT DETECTED NOT DETECTED Final   Cyclospora cayetanensis NOT DETECTED NOT DETECTED Final   Entamoeba histolytica NOT DETECTED NOT DETECTED Final   Giardia lamblia NOT DETECTED NOT DETECTED Final   Adenovirus F40/41 NOT DETECTED NOT DETECTED Final   Astrovirus NOT DETECTED NOT DETECTED Final   Norovirus GI/GII NOT DETECTED NOT DETECTED Final   Rotavirus A NOT DETECTED NOT DETECTED Final   Sapovirus (I, II, IV, and V) NOT DETECTED NOT DETECTED Final  C difficile quick scan w PCR reflex     Status: None   Collection Time: 08/30/15  5:02 AM  Result Value Ref Range Status   C Diff antigen NEGATIVE NEGATIVE Final   C Diff toxin NEGATIVE NEGATIVE Final   C Diff interpretation Negative for toxigenic C. difficile  Final  MRSA PCR Screening     Status: None   Collection Time: 08/30/15  8:17 AM  Result Value Ref Range Status   MRSA by PCR NEGATIVE NEGATIVE Final    Comment:        The GeneXpert MRSA Assay (FDA approved for NASAL specimens only), is one component of a comprehensive MRSA colonization surveillance program. It is not intended to diagnose MRSA infection nor to guide or monitor treatment for MRSA infections.       Labs: Basic Metabolic Panel:  Recent Labs Lab 08/30/15 0411 08/31/15 0354 09/01/15 0415  NA 136 140 144  K 4.5 3.6 3.2*  CL 104 110 114*  CO2 21* 18* 23  GLUCOSE 87 87 108*  BUN 68* 45* 25*  CREATININE 3.68* 2.17* 1.21  CALCIUM 8.3* 7.0* 6.5*  MG 1.7  --   --    Liver Function Tests:  Recent Labs Lab 08/30/15 0411 08/31/15 0354  AST 27 48*  ALT 23 32  ALKPHOS 52 59  BILITOT 0.9 1.0  PROT 5.6* 5.2*  ALBUMIN 3.3* 3.2*    Recent Labs Lab 08/30/15 0411  LIPASE 27   No results for input(s): AMMONIA in the last 168 hours. CBC:  Recent Labs Lab 08/30/15 0411 08/31/15 0354 09/01/15 0415  WBC 7.0 3.7* 3.0*  NEUTROABS 5.0  --   --   HGB 14.2 12.9* 11.8*  HCT 41.9 38.6* 35.3*  MCV 90.7 91.9 91.5  PLT 175 134* 125*   Cardiac Enzymes: No results for input(s): CKTOTAL, CKMB, CKMBINDEX, TROPONINI in the last 168 hours. BNP: BNP (last 3 results) No results for input(s): BNP in the last 8760 hours.  ProBNP (last 3 results) No results for input(s): PROBNP in the last 8760 hours.  CBG: No results for input(s): GLUCAP in the last 168 hours.     SignedDomenic Polite MD   [facility] 09/01/2015, 8:32 AM

## 2015-09-04 LAB — CULTURE, BLOOD (ROUTINE X 2)
CULTURE: NO GROWTH
Culture: NO GROWTH

## 2016-02-19 ENCOUNTER — Emergency Department (HOSPITAL_COMMUNITY)
Admission: EM | Admit: 2016-02-19 | Discharge: 2016-02-19 | Disposition: A | Discharge status: Home / Self Care | Payer: PRIVATE HEALTH INSURANCE | Attending: Dermatology | Admitting: Dermatology

## 2016-02-19 ENCOUNTER — Other Ambulatory Visit: Payer: Self-pay

## 2016-02-19 ENCOUNTER — Encounter (HOSPITAL_COMMUNITY): Payer: Self-pay

## 2016-02-19 DIAGNOSIS — C3490 Malignant neoplasm of unspecified part of unspecified bronchus or lung: Secondary | ICD-10-CM | POA: Insufficient documentation

## 2016-02-19 DIAGNOSIS — R042 Hemoptysis: Secondary | ICD-10-CM | POA: Diagnosis present

## 2016-02-19 DIAGNOSIS — Z5321 Procedure and treatment not carried out due to patient leaving prior to being seen by health care provider: Secondary | ICD-10-CM | POA: Diagnosis not present

## 2016-02-19 NOTE — ED Notes (Signed)
He has had a long term cough d/t lung cancer which is worse lately; plus today he has coughed up a quantity of blood.  He is in no distress and has family members with him at all times.

## 2016-02-19 NOTE — ED Notes (Signed)
Patient decided to leave due to they where waiting and did not wait any longer. Nurse tried to explain some people where being discharge and we are waiting to place them in the bed. They did not want to wait and went to [place] where his doctor is.

## 2016-03-04 DEATH — deceased
